# Patient Record
Sex: Female | Born: 1965 | Race: Black or African American | Hispanic: No | Smoking: Former smoker
Health system: Southern US, Community
[De-identification: ages and names within clinical notes are randomized; demographics above are authoritative.]

## PROBLEM LIST (undated history)

## (undated) DIAGNOSIS — I10 Essential (primary) hypertension: Secondary | ICD-10-CM

## (undated) DIAGNOSIS — G43909 Migraine, unspecified, not intractable, without status migrainosus: Secondary | ICD-10-CM

## (undated) DIAGNOSIS — K5792 Diverticulitis of intestine, part unspecified, without perforation or abscess without bleeding: Secondary | ICD-10-CM

## (undated) DIAGNOSIS — F419 Anxiety disorder, unspecified: Secondary | ICD-10-CM

## (undated) HISTORY — PX: TUBAL LIGATION: SHX77

---

## 2015-10-19 DIAGNOSIS — Z0001 Encounter for general adult medical examination with abnormal findings: Secondary | ICD-10-CM | POA: Diagnosis not present

## 2015-10-19 DIAGNOSIS — Z881 Allergy status to other antibiotic agents status: Secondary | ICD-10-CM | POA: Diagnosis not present

## 2015-10-19 DIAGNOSIS — Z79899 Other long term (current) drug therapy: Secondary | ICD-10-CM | POA: Diagnosis not present

## 2015-10-19 DIAGNOSIS — K5700 Diverticulitis of small intestine with perforation and abscess without bleeding: Secondary | ICD-10-CM | POA: Diagnosis not present

## 2015-10-19 DIAGNOSIS — Z880 Allergy status to penicillin: Secondary | ICD-10-CM | POA: Diagnosis not present

## 2015-10-19 DIAGNOSIS — Z83511 Family history of glaucoma: Secondary | ICD-10-CM | POA: Diagnosis not present

## 2015-10-19 DIAGNOSIS — K5792 Diverticulitis of intestine, part unspecified, without perforation or abscess without bleeding: Secondary | ICD-10-CM | POA: Diagnosis not present

## 2015-10-19 DIAGNOSIS — D589 Hereditary hemolytic anemia, unspecified: Secondary | ICD-10-CM | POA: Diagnosis not present

## 2015-10-19 DIAGNOSIS — K589 Irritable bowel syndrome without diarrhea: Secondary | ICD-10-CM | POA: Diagnosis not present

## 2015-10-19 DIAGNOSIS — R799 Abnormal finding of blood chemistry, unspecified: Secondary | ICD-10-CM | POA: Diagnosis not present

## 2015-10-19 DIAGNOSIS — I10 Essential (primary) hypertension: Secondary | ICD-10-CM | POA: Diagnosis not present

## 2015-10-19 DIAGNOSIS — Z87891 Personal history of nicotine dependence: Secondary | ICD-10-CM | POA: Diagnosis not present

## 2015-10-19 DIAGNOSIS — Z888 Allergy status to other drugs, medicaments and biological substances status: Secondary | ICD-10-CM | POA: Diagnosis not present

## 2015-10-28 DIAGNOSIS — Z1231 Encounter for screening mammogram for malignant neoplasm of breast: Secondary | ICD-10-CM | POA: Diagnosis not present

## 2015-11-15 DIAGNOSIS — R109 Unspecified abdominal pain: Secondary | ICD-10-CM | POA: Diagnosis not present

## 2015-11-15 DIAGNOSIS — K5721 Diverticulitis of large intestine with perforation and abscess with bleeding: Secondary | ICD-10-CM | POA: Diagnosis not present

## 2015-11-15 DIAGNOSIS — K5909 Other constipation: Secondary | ICD-10-CM | POA: Diagnosis not present

## 2015-11-15 DIAGNOSIS — K580 Irritable bowel syndrome with diarrhea: Secondary | ICD-10-CM | POA: Diagnosis not present

## 2015-11-15 DIAGNOSIS — Z23 Encounter for immunization: Secondary | ICD-10-CM | POA: Diagnosis not present

## 2015-11-15 DIAGNOSIS — R630 Anorexia: Secondary | ICD-10-CM | POA: Diagnosis not present

## 2015-11-15 DIAGNOSIS — Z888 Allergy status to other drugs, medicaments and biological substances status: Secondary | ICD-10-CM | POA: Diagnosis not present

## 2015-11-15 DIAGNOSIS — Z881 Allergy status to other antibiotic agents status: Secondary | ICD-10-CM | POA: Diagnosis not present

## 2015-11-15 DIAGNOSIS — Z880 Allergy status to penicillin: Secondary | ICD-10-CM | POA: Diagnosis not present

## 2015-11-15 DIAGNOSIS — Z79899 Other long term (current) drug therapy: Secondary | ICD-10-CM | POA: Diagnosis not present

## 2015-11-15 DIAGNOSIS — Z87891 Personal history of nicotine dependence: Secondary | ICD-10-CM | POA: Diagnosis not present

## 2015-11-15 DIAGNOSIS — R194 Change in bowel habit: Secondary | ICD-10-CM | POA: Diagnosis not present

## 2015-12-30 DIAGNOSIS — K909 Intestinal malabsorption, unspecified: Secondary | ICD-10-CM | POA: Diagnosis not present

## 2015-12-30 DIAGNOSIS — K5900 Constipation, unspecified: Secondary | ICD-10-CM | POA: Diagnosis not present

## 2015-12-30 DIAGNOSIS — R140 Abdominal distension (gaseous): Secondary | ICD-10-CM | POA: Diagnosis not present

## 2015-12-30 DIAGNOSIS — K5909 Other constipation: Secondary | ICD-10-CM | POA: Diagnosis not present

## 2015-12-30 DIAGNOSIS — R630 Anorexia: Secondary | ICD-10-CM | POA: Diagnosis not present

## 2015-12-30 DIAGNOSIS — R143 Flatulence: Secondary | ICD-10-CM | POA: Diagnosis not present

## 2015-12-30 DIAGNOSIS — R1030 Lower abdominal pain, unspecified: Secondary | ICD-10-CM | POA: Diagnosis not present

## 2016-02-03 DIAGNOSIS — H527 Unspecified disorder of refraction: Secondary | ICD-10-CM | POA: Diagnosis not present

## 2016-02-03 DIAGNOSIS — H04123 Dry eye syndrome of bilateral lacrimal glands: Secondary | ICD-10-CM | POA: Diagnosis not present

## 2016-02-03 DIAGNOSIS — Z83511 Family history of glaucoma: Secondary | ICD-10-CM | POA: Diagnosis not present

## 2016-02-03 DIAGNOSIS — H5213 Myopia, bilateral: Secondary | ICD-10-CM | POA: Diagnosis not present

## 2016-02-03 DIAGNOSIS — Z87891 Personal history of nicotine dependence: Secondary | ICD-10-CM | POA: Diagnosis not present

## 2016-02-03 DIAGNOSIS — H524 Presbyopia: Secondary | ICD-10-CM | POA: Diagnosis not present

## 2016-02-03 DIAGNOSIS — Z79899 Other long term (current) drug therapy: Secondary | ICD-10-CM | POA: Diagnosis not present

## 2016-02-03 DIAGNOSIS — H40013 Open angle with borderline findings, low risk, bilateral: Secondary | ICD-10-CM | POA: Diagnosis not present

## 2016-02-22 DIAGNOSIS — Z0000 Encounter for general adult medical examination without abnormal findings: Secondary | ICD-10-CM | POA: Diagnosis not present

## 2016-02-22 DIAGNOSIS — K580 Irritable bowel syndrome with diarrhea: Secondary | ICD-10-CM | POA: Diagnosis not present

## 2016-02-22 DIAGNOSIS — I10 Essential (primary) hypertension: Secondary | ICD-10-CM | POA: Diagnosis not present

## 2016-05-03 DIAGNOSIS — R300 Dysuria: Secondary | ICD-10-CM | POA: Diagnosis not present

## 2016-05-03 DIAGNOSIS — R1031 Right lower quadrant pain: Secondary | ICD-10-CM | POA: Diagnosis not present

## 2016-05-03 DIAGNOSIS — R6883 Chills (without fever): Secondary | ICD-10-CM | POA: Diagnosis not present

## 2016-05-03 DIAGNOSIS — K5730 Diverticulosis of large intestine without perforation or abscess without bleeding: Secondary | ICD-10-CM | POA: Diagnosis not present

## 2016-05-03 DIAGNOSIS — R112 Nausea with vomiting, unspecified: Secondary | ICD-10-CM | POA: Diagnosis not present

## 2016-05-03 DIAGNOSIS — Z6830 Body mass index (BMI) 30.0-30.9, adult: Secondary | ICD-10-CM | POA: Diagnosis not present

## 2016-05-03 DIAGNOSIS — R630 Anorexia: Secondary | ICD-10-CM | POA: Diagnosis not present

## 2016-05-03 DIAGNOSIS — R1032 Left lower quadrant pain: Secondary | ICD-10-CM | POA: Diagnosis not present

## 2016-05-03 DIAGNOSIS — Z87891 Personal history of nicotine dependence: Secondary | ICD-10-CM | POA: Diagnosis not present

## 2016-05-04 ENCOUNTER — Encounter (HOSPITAL_COMMUNITY): Payer: Self-pay | Admitting: Family Medicine

## 2016-05-04 ENCOUNTER — Inpatient Hospital Stay (HOSPITAL_COMMUNITY)
Admission: EM | Admit: 2016-05-04 | Discharge: 2016-05-11 | DRG: 392 | Disposition: A | Discharge status: Home / Self Care | Payer: Medicare Other | Attending: General Surgery | Admitting: General Surgery

## 2016-05-04 DIAGNOSIS — K5780 Diverticulitis of intestine, part unspecified, with perforation and abscess without bleeding: Secondary | ICD-10-CM | POA: Diagnosis not present

## 2016-05-04 DIAGNOSIS — K630 Abscess of intestine: Secondary | ICD-10-CM | POA: Diagnosis not present

## 2016-05-04 DIAGNOSIS — Z881 Allergy status to other antibiotic agents status: Secondary | ICD-10-CM

## 2016-05-04 DIAGNOSIS — I10 Essential (primary) hypertension: Secondary | ICD-10-CM | POA: Diagnosis present

## 2016-05-04 DIAGNOSIS — R1084 Generalized abdominal pain: Secondary | ICD-10-CM | POA: Diagnosis not present

## 2016-05-04 DIAGNOSIS — Z113 Encounter for screening for infections with a predominantly sexual mode of transmission: Secondary | ICD-10-CM | POA: Diagnosis not present

## 2016-05-04 DIAGNOSIS — R1030 Lower abdominal pain, unspecified: Secondary | ICD-10-CM | POA: Diagnosis not present

## 2016-05-04 DIAGNOSIS — B954 Other streptococcus as the cause of diseases classified elsewhere: Secondary | ICD-10-CM | POA: Diagnosis not present

## 2016-05-04 DIAGNOSIS — R87610 Atypical squamous cells of undetermined significance on cytologic smear of cervix (ASC-US): Secondary | ICD-10-CM | POA: Diagnosis not present

## 2016-05-04 DIAGNOSIS — Z87891 Personal history of nicotine dependence: Secondary | ICD-10-CM | POA: Diagnosis not present

## 2016-05-04 DIAGNOSIS — Z01419 Encounter for gynecological examination (general) (routine) without abnormal findings: Secondary | ICD-10-CM | POA: Diagnosis not present

## 2016-05-04 DIAGNOSIS — K5732 Diverticulitis of large intestine without perforation or abscess without bleeding: Secondary | ICD-10-CM

## 2016-05-04 DIAGNOSIS — Z880 Allergy status to penicillin: Secondary | ICD-10-CM

## 2016-05-04 DIAGNOSIS — Z91013 Allergy to seafood: Secondary | ICD-10-CM | POA: Diagnosis not present

## 2016-05-04 DIAGNOSIS — K5792 Diverticulitis of intestine, part unspecified, without perforation or abscess without bleeding: Secondary | ICD-10-CM | POA: Diagnosis not present

## 2016-05-04 DIAGNOSIS — K5720 Diverticulitis of large intestine with perforation and abscess without bleeding: Secondary | ICD-10-CM | POA: Diagnosis not present

## 2016-05-04 DIAGNOSIS — R197 Diarrhea, unspecified: Secondary | ICD-10-CM | POA: Diagnosis not present

## 2016-05-04 DIAGNOSIS — L0291 Cutaneous abscess, unspecified: Secondary | ICD-10-CM

## 2016-05-04 DIAGNOSIS — D259 Leiomyoma of uterus, unspecified: Secondary | ICD-10-CM | POA: Diagnosis not present

## 2016-05-04 HISTORY — DX: Essential (primary) hypertension: I10

## 2016-05-04 HISTORY — DX: Diverticulitis of intestine, part unspecified, without perforation or abscess without bleeding: K57.92

## 2016-05-04 LAB — URINALYSIS, ROUTINE W REFLEX MICROSCOPIC
GLUCOSE, UA: NEGATIVE mg/dL
Hgb urine dipstick: NEGATIVE
Ketones, ur: >80 mg/dL — AB
LEUKOCYTES UA: NEGATIVE
Nitrite: NEGATIVE
PROTEIN: 30 mg/dL — AB
SPECIFIC GRAVITY, URINE: 1.031 — AB (ref 1.005–1.030)
pH: 6 (ref 5.0–8.0)

## 2016-05-04 LAB — COMPREHENSIVE METABOLIC PANEL
ALT: 13 U/L — ABNORMAL LOW (ref 14–54)
ANION GAP: 12 (ref 5–15)
AST: 16 U/L (ref 15–41)
Albumin: 3.4 g/dL — ABNORMAL LOW (ref 3.5–5.0)
Alkaline Phosphatase: 66 U/L (ref 38–126)
BUN: <5 mg/dL — ABNORMAL LOW (ref 6–20)
CHLORIDE: 105 mmol/L (ref 101–111)
CO2: 20 mmol/L — AB (ref 22–32)
CREATININE: 0.87 mg/dL (ref 0.44–1.00)
Calcium: 9.2 mg/dL (ref 8.9–10.3)
Glucose, Bld: 101 mg/dL — ABNORMAL HIGH (ref 65–99)
POTASSIUM: 3.2 mmol/L — AB (ref 3.5–5.1)
SODIUM: 137 mmol/L (ref 135–145)
Total Bilirubin: 0.9 mg/dL (ref 0.3–1.2)
Total Protein: 7.6 g/dL (ref 6.5–8.1)

## 2016-05-04 LAB — CBC
HEMATOCRIT: 37.8 % (ref 36.0–46.0)
HEMOGLOBIN: 13.3 g/dL (ref 12.0–15.0)
MCH: 32.9 pg (ref 26.0–34.0)
MCHC: 35.2 g/dL (ref 30.0–36.0)
MCV: 93.6 fL (ref 78.0–100.0)
PLATELETS: 349 10*3/uL (ref 150–400)
RBC: 4.04 MIL/uL (ref 3.87–5.11)
RDW: 13.2 % (ref 11.5–15.5)
WBC: 20.5 10*3/uL — AB (ref 4.0–10.5)

## 2016-05-04 LAB — URINE MICROSCOPIC-ADD ON

## 2016-05-04 LAB — I-STAT BETA HCG BLOOD, ED (MC, WL, AP ONLY): I-stat hCG, quantitative: <5 m[IU]/mL (ref <5)

## 2016-05-04 LAB — LIPASE, BLOOD: LIPASE: 15 U/L (ref 11–51)

## 2016-05-04 MED ORDER — ONDANSETRON 4 MG PO TBDP
ORAL_TABLET | ORAL | Status: AC
  Filled 2016-05-04: qty 1

## 2016-05-04 MED ORDER — ONDANSETRON 4 MG PO TBDP
4.0000 mg | ORAL_TABLET | Freq: Once | ORAL | Status: AC | PRN
  Administered 2016-05-04: 4 mg via ORAL

## 2016-05-04 NOTE — ED Provider Notes (Addendum)
[place] DEPT Provider Note   CSN: [id number] Arrival date & time: 05/04/16  1804  By signing my name below, I, Maud Deed. Royston Sinner, attest that this documentation has been prepared under the direction and in the presence of Jola Schmidt, MD.  Electronically Signed: Maud Deed. Royston Sinner, ED Scribe. 05/05/16. 12:21 AM.    History   Chief Complaint Chief Complaint  Patient presents with  . Abdominal Pain   The history is provided by the patient and a relative. No language interpreter was used.    HPI Comments: Jasmine Cobb, brought in by POV is a 50 y.o. female with a PMHx of diverticulitis and HTN who presents to the Emergency Department complaining of constant, worsening lower abdominal pain that radiates to the back x 5 days. Pain is described as sharp. No aggravating or alleviating factors at this time. She also reports associated nausea, vomiting, constipation, and "pus in my stool". No recent fever, chills, shortness of breath, or chest pain. Pt states she was evaluated at [facility] last night for same and was told she needed surgery per triage note. However, pt left AMA. Pt admits to an episode of diverticulitis 2 years ago. At that time, she was treated with antibiotics. However, surgery was not indicated for treatment.  PCP: No primary care provider on file.    Past Medical History:  Diagnosis Date  . Diverticulitis   . Hypertension     There are no active problems to display for this patient.   Past Surgical History:  Procedure Laterality Date  . TUBAL LIGATION      OB History    No data available       Home Medications    Prior to Admission medications   Not on File    Family History No family history on file.  Social History Social History  Substance Use Topics  . Smoking status: Former Smoker    Types: Cigarettes  . Smokeless tobacco: Never Used  . Alcohol use Yes     Comment: Rare     Allergies   Ciprofloxacin; Penicillins; and Shellfish  allergy   Review of Systems Review of Systems  Constitutional: Negative for chills and fever.  Respiratory: Negative for shortness of breath.   Cardiovascular: Negative for chest pain.  Gastrointestinal: Positive for abdominal pain, constipation, nausea and vomiting.  All other systems reviewed and are negative.    Physical Exam Updated Vital Signs BP 153/81 (BP Location: Right Arm)   Pulse 71   Temp 98.4 F (36.9 C) (Oral)   Resp 20   LMP 04/21/2016   SpO2 100%   Physical Exam  Constitutional: She is oriented to person, place, and time. She appears well-developed and well-nourished. No distress.  HENT:  Head: Normocephalic and atraumatic.  Eyes: EOM are normal.  Neck: Normal range of motion.  Cardiovascular: Normal rate, regular rhythm and normal heart sounds.   Pulmonary/Chest: Effort normal and breath sounds normal.  Abdominal: Soft. She exhibits no distension. There is tenderness.  Tenderness to palpation over suprapubic region and LLQ.  Musculoskeletal: Normal range of motion.  Neurological: She is alert and oriented to person, place, and time.  Skin: Skin is warm and dry.  Psychiatric: She has a normal mood and affect. Judgment normal.  Nursing note and vitals reviewed.    ED Treatments / Results   DIAGNOSTIC STUDIES: Oxygen Saturation is 100% on RA, Normal by my interpretation.    COORDINATION OF CARE: 12:18 AM- Will give fluids, Zofran, and Dilaudid.  Will order imaging, pregnancy urine, blood work, and urinalysis. Discussed treatment plan with pt at bedside and pt agreed to plan.     Labs (all labs ordered are listed, but only abnormal results are displayed) Labs Reviewed  COMPREHENSIVE METABOLIC PANEL - Abnormal; Notable for the following:       Result Value   Potassium 3.2 (*)    CO2 20 (*)    Glucose, Bld 101 (*)    BUN <5 (*)    Albumin 3.4 (*)    ALT 13 (*)    All other components within normal limits  CBC - Abnormal; Notable for the  following:    WBC 20.5 (*)    All other components within normal limits  URINALYSIS, ROUTINE W REFLEX MICROSCOPIC (NOT AT [facility]) - Abnormal; Notable for the following:    APPearance HAZY (*)    Specific Gravity, Urine 1.031 (*)    Bilirubin Urine SMALL (*)    Ketones, ur >80 (*)    Protein, ur 30 (*)    All other components within normal limits  URINE MICROSCOPIC-ADD ON - Abnormal; Notable for the following:    Squamous Epithelial / LPF 6-30 (*)    Bacteria, UA FEW (*)    All other components within normal limits  LIPASE, BLOOD  I-STAT BETA HCG BLOOD, ED ([facility], [facility], [facility] ONLY)    EKG  EKG Interpretation None       Radiology No results found.  Procedures Procedures (including critical care time)   +++++++++++++++++++++++++++++++++++++++++  CRITICAL CARE Performed by: Hoy Morn Total critical care time: 35 minutes Critical care time was exclusive of separately billable procedures and treating other patients. Critical care was necessary to treat or prevent imminent or life-threatening deterioration. Critical care was time spent personally by me on the following activities: development of treatment plan with patient and/or surrogate as well as nursing, discussions with consultants, evaluation of patient's response to treatment, examination of patient, obtaining history from patient or surrogate, ordering and performing treatments and interventions, ordering and review of laboratory studies, ordering and review of radiographic studies, pulse oximetry and re-evaluation of patient's condition.   +++++++++++++++++++++++++++++++++++++++++++++++   Medications Ordered in ED Medications  0.9 %  sodium chloride infusion (1,000 mLs Intravenous New Bag/Given 05/05/16 0041)    Followed by  0.9 %  sodium chloride infusion (1,000 mLs Intravenous New Bag/Given 05/05/16 0042)  ceFEPIme (MAXIPIME) 2 g in dextrose 5 % 50 mL IVPB (not administered)    And  metroNIDAZOLE (FLAGYL) IVPB 500 mg  (not administered)  ondansetron (ZOFRAN-ODT) disintegrating tablet 4 mg (4 mg Oral Given 05/04/16 1828)  ondansetron (ZOFRAN) injection 4 mg (4 mg Intravenous Given 05/05/16 0042)  HYDROmorphone (DILAUDID) injection 1 mg (1 mg Intravenous Given 05/05/16 0042)  iopamidol (ISOVUE-300) 61 % injection (100 mLs  Contrast Given 05/05/16 0117)     Initial Impression / Assessment and Plan / ED Course  I have reviewed the triage vital signs and the nursing notes.  Pertinent labs & imaging results that were available during my care of the patient were reviewed by me and considered in my medical decision making (see chart for details).  Clinical Course    Patient with a 3 cm intra-abdominal abscess likely diverticular in nature given her history of diverticulitis this is a contained perforation.  Patient has a penicillin allergy therefore she cannot receive Zosyn and so I have given her cefepime and Flagyl IV.  I'll contact general surgery for consultation and possible admission versus  hospitalist admission for IV antibiotics.    Final Clinical Impressions(s) / ED Diagnoses   Final diagnoses:  Colonic diverticular abscess    New Prescriptions New Prescriptions   No medications on file   I personally performed the services described in this documentation, which was scribed in my presence. The recorded information has been reviewed and is accurate.        Jola Schmidt, MD 05/05/16 [place], MD 05/05/16 [phone]

## 2016-05-04 NOTE — ED Notes (Signed)
Pt up to desk, updated on wait, encouraged, alert, NAD, calm, interactive, resps e/u, c/o nv and abd pain, actively vomiting.

## 2016-05-04 NOTE — ED Triage Notes (Addendum)
Pt presents from home via POV with c/o low abdominal pain that began 8 days ago and has gradually worsened.  Hx diverticulosis/diverticulitis and tried to manage the pain at home. Endorses NV and constipation, denies diarrhea.  A&Ox4. States was seen at [facility] last night and was told she needed surgery, but left AMA.

## 2016-05-04 NOTE — ED Notes (Signed)
Pt called for vitals x2, no answer 

## 2016-05-04 NOTE — ED Notes (Signed)
Pt unable to void at this time. 

## 2016-05-05 ENCOUNTER — Encounter (HOSPITAL_COMMUNITY): Payer: Self-pay | Admitting: General Surgery

## 2016-05-05 ENCOUNTER — Emergency Department (HOSPITAL_COMMUNITY): Payer: Medicare Other

## 2016-05-05 ENCOUNTER — Inpatient Hospital Stay (HOSPITAL_COMMUNITY): Payer: Medicare Other

## 2016-05-05 DIAGNOSIS — I10 Essential (primary) hypertension: Secondary | ICD-10-CM | POA: Diagnosis present

## 2016-05-05 DIAGNOSIS — K5780 Diverticulitis of intestine, part unspecified, with perforation and abscess without bleeding: Secondary | ICD-10-CM | POA: Diagnosis not present

## 2016-05-05 DIAGNOSIS — Z880 Allergy status to penicillin: Secondary | ICD-10-CM | POA: Diagnosis not present

## 2016-05-05 DIAGNOSIS — Z91013 Allergy to seafood: Secondary | ICD-10-CM | POA: Diagnosis not present

## 2016-05-05 DIAGNOSIS — B954 Other streptococcus as the cause of diseases classified elsewhere: Secondary | ICD-10-CM | POA: Diagnosis present

## 2016-05-05 DIAGNOSIS — R1030 Lower abdominal pain, unspecified: Secondary | ICD-10-CM | POA: Diagnosis not present

## 2016-05-05 DIAGNOSIS — D259 Leiomyoma of uterus, unspecified: Secondary | ICD-10-CM | POA: Diagnosis not present

## 2016-05-05 DIAGNOSIS — R1084 Generalized abdominal pain: Secondary | ICD-10-CM | POA: Diagnosis not present

## 2016-05-05 DIAGNOSIS — Z87891 Personal history of nicotine dependence: Secondary | ICD-10-CM | POA: Diagnosis not present

## 2016-05-05 DIAGNOSIS — K5720 Diverticulitis of large intestine with perforation and abscess without bleeding: Secondary | ICD-10-CM | POA: Diagnosis present

## 2016-05-05 DIAGNOSIS — Z881 Allergy status to other antibiotic agents status: Secondary | ICD-10-CM | POA: Diagnosis not present

## 2016-05-05 DIAGNOSIS — K651 Peritoneal abscess: Secondary | ICD-10-CM | POA: Diagnosis not present

## 2016-05-05 DIAGNOSIS — N739 Female pelvic inflammatory disease, unspecified: Secondary | ICD-10-CM | POA: Diagnosis not present

## 2016-05-05 LAB — CBC
HCT: 34.9 % — ABNORMAL LOW (ref 36.0–46.0)
HEMOGLOBIN: 11.7 g/dL — AB (ref 12.0–15.0)
MCH: 32.3 pg (ref 26.0–34.0)
MCHC: 33.5 g/dL (ref 30.0–36.0)
MCV: 96.4 fL (ref 78.0–100.0)
Platelets: 296 10*3/uL (ref 150–400)
RBC: 3.62 MIL/uL — AB (ref 3.87–5.11)
RDW: 13.2 % (ref 11.5–15.5)
WBC: 22.4 10*3/uL — AB (ref 4.0–10.5)

## 2016-05-05 LAB — BASIC METABOLIC PANEL
ANION GAP: 10 (ref 5–15)
BUN: <5 mg/dL — ABNORMAL LOW (ref 6–20)
CHLORIDE: 107 mmol/L (ref 101–111)
CO2: 22 mmol/L (ref 22–32)
CREATININE: 0.8 mg/dL (ref 0.44–1.00)
Calcium: 8.3 mg/dL — ABNORMAL LOW (ref 8.9–10.3)
GFR calc non Af Amer: >60 mL/min (ref >60)
Glucose, Bld: 117 mg/dL — ABNORMAL HIGH (ref 65–99)
Potassium: 3.6 mmol/L (ref 3.5–5.1)
SODIUM: 139 mmol/L (ref 135–145)

## 2016-05-05 LAB — PROTIME-INR
INR: 1.12
PROTHROMBIN TIME: 14.5 s (ref 11.4–15.2)

## 2016-05-05 LAB — APTT: APTT: 37 s — AB (ref 24–36)

## 2016-05-05 MED ORDER — ONDANSETRON HCL 4 MG/2ML IJ SOLN
4.0000 mg | Freq: Four times a day (QID) | INTRAMUSCULAR | Status: DC | PRN
  Administered 2016-05-05: 4 mg via INTRAVENOUS
  Filled 2016-05-05: qty 2

## 2016-05-05 MED ORDER — KCL IN DEXTROSE-NACL 20-5-0.45 MEQ/L-%-% IV SOLN
INTRAVENOUS | Status: DC
  Administered 2016-05-05 – 2016-05-08 (×7): via INTRAVENOUS
  Filled 2016-05-05 (×6): qty 1000

## 2016-05-05 MED ORDER — DIPHENHYDRAMINE HCL 25 MG PO CAPS: 25.0000 mg | ORAL_CAPSULE | Freq: Four times a day (QID) | ORAL | Status: DC | PRN

## 2016-05-05 MED ORDER — HYDROMORPHONE HCL 1 MG/ML IJ SOLN
1.0000 mg | Freq: Once | INTRAMUSCULAR | Status: AC
  Administered 2016-05-05: 1 mg via INTRAVENOUS
  Filled 2016-05-05: qty 1

## 2016-05-05 MED ORDER — DEXTROSE 5 % IV SOLN
2.0000 g | Freq: Three times a day (TID) | INTRAVENOUS | Status: DC
  Administered 2016-05-05 – 2016-05-10 (×15): 2 g via INTRAVENOUS
  Filled 2016-05-05 (×19): qty 2

## 2016-05-05 MED ORDER — ACETAMINOPHEN 325 MG PO TABS
650.0000 mg | ORAL_TABLET | Freq: Four times a day (QID) | ORAL | Status: DC | PRN
  Administered 2016-05-06: 650 mg via ORAL
  Filled 2016-05-05: qty 2

## 2016-05-05 MED ORDER — ONDANSETRON HCL 4 MG/2ML IJ SOLN
4.0000 mg | Freq: Once | INTRAMUSCULAR | Status: AC
  Administered 2016-05-05: 4 mg via INTRAVENOUS
  Filled 2016-05-05: qty 2

## 2016-05-05 MED ORDER — DEXTROSE 5 % IV SOLN
2.0000 g | Freq: Once | INTRAVENOUS | Status: AC
  Administered 2016-05-05: 2 g via INTRAVENOUS
  Filled 2016-05-05: qty 2

## 2016-05-05 MED ORDER — ONDANSETRON 4 MG PO TBDP
4.0000 mg | ORAL_TABLET | Freq: Four times a day (QID) | ORAL | Status: DC | PRN
Start: 2016-05-05 — End: 2016-05-11

## 2016-05-05 MED ORDER — LIDOCAINE HCL 1 % IJ SOLN
INTRAMUSCULAR | Status: AC
  Filled 2016-05-05: qty 20

## 2016-05-05 MED ORDER — METRONIDAZOLE IN NACL 5-0.79 MG/ML-% IV SOLN
500.0000 mg | Freq: Once | INTRAVENOUS | Status: AC
  Administered 2016-05-05: 500 mg via INTRAVENOUS
  Filled 2016-05-05: qty 100

## 2016-05-05 MED ORDER — METRONIDAZOLE IN NACL 5-0.79 MG/ML-% IV SOLN
500.0000 mg | Freq: Three times a day (TID) | INTRAVENOUS | Status: DC
  Administered 2016-05-05 – 2016-05-10 (×15): 500 mg via INTRAVENOUS
  Filled 2016-05-05 (×19): qty 100

## 2016-05-05 MED ORDER — FENTANYL CITRATE (PF) 100 MCG/2ML IJ SOLN
INTRAMUSCULAR | Status: AC | PRN
  Administered 2016-05-05 (×4): 50 ug via INTRAVENOUS

## 2016-05-05 MED ORDER — ACETAMINOPHEN 650 MG RE SUPP: 650.0000 mg | Freq: Four times a day (QID) | RECTAL | Status: DC | PRN

## 2016-05-05 MED ORDER — ENOXAPARIN SODIUM 40 MG/0.4ML ~~LOC~~ SOLN
40.0000 mg | SUBCUTANEOUS | Status: DC
  Administered 2016-05-06 – 2016-05-08 (×2): 40 mg via SUBCUTANEOUS
  Filled 2016-05-05 (×2): qty 0.4

## 2016-05-05 MED ORDER — WHITE PETROLATUM GEL
Status: AC
  Administered 2016-05-05: 05:00:00
  Filled 2016-05-05: qty 1

## 2016-05-05 MED ORDER — FENTANYL CITRATE (PF) 100 MCG/2ML IJ SOLN
INTRAMUSCULAR | Status: AC
  Filled 2016-05-05: qty 4

## 2016-05-05 MED ORDER — MIDAZOLAM HCL 2 MG/2ML IJ SOLN
INTRAMUSCULAR | Status: AC
  Filled 2016-05-05: qty 6

## 2016-05-05 MED ORDER — OXYCODONE HCL 5 MG PO TABS: 5.0000 mg | ORAL_TABLET | ORAL | Status: DC | PRN

## 2016-05-05 MED ORDER — MIDAZOLAM HCL 2 MG/2ML IJ SOLN
INTRAMUSCULAR | Status: AC | PRN
  Administered 2016-05-05: 1 mg via INTRAVENOUS
  Administered 2016-05-05: 2 mg via INTRAVENOUS
  Administered 2016-05-05: 1 mg via INTRAVENOUS

## 2016-05-05 MED ORDER — IOPAMIDOL (ISOVUE-300) INJECTION 61%
INTRAVENOUS | Status: AC
  Administered 2016-05-05: 100 mL
  Filled 2016-05-05: qty 100

## 2016-05-05 MED ORDER — MORPHINE SULFATE (PF) 2 MG/ML IV SOLN
2.0000 mg | INTRAVENOUS | Status: DC | PRN
  Administered 2016-05-05 (×2): 4 mg via INTRAVENOUS
  Administered 2016-05-05: 2 mg via INTRAVENOUS
  Administered 2016-05-05 – 2016-05-06 (×5): 4 mg via INTRAVENOUS
  Filled 2016-05-05: qty 1
  Filled 2016-05-05 (×7): qty 2

## 2016-05-05 MED ORDER — SODIUM CHLORIDE 0.9 % IV SOLN
1000.0000 mL | INTRAVENOUS | Status: DC
  Administered 2016-05-05: 1000 mL via INTRAVENOUS

## 2016-05-05 MED ORDER — ENOXAPARIN SODIUM 40 MG/0.4ML ~~LOC~~ SOLN: 40.0000 mg | SUBCUTANEOUS | Status: DC

## 2016-05-05 MED ORDER — DIPHENHYDRAMINE HCL 50 MG/ML IJ SOLN: 25.0000 mg | Freq: Four times a day (QID) | INTRAMUSCULAR | Status: DC | PRN

## 2016-05-05 MED ORDER — SODIUM CHLORIDE 0.9 % IV SOLN
1000.0000 mL | Freq: Once | INTRAVENOUS | Status: AC
  Administered 2016-05-05: 1000 mL via INTRAVENOUS

## 2016-05-05 MED ORDER — HYDRALAZINE HCL 20 MG/ML IJ SOLN: 10.0000 mg | INTRAMUSCULAR | Status: DC | PRN

## 2016-05-05 NOTE — Progress Notes (Signed)
Pt has not void yet. Bladder scan done: 241ml. Dr Paged.

## 2016-05-05 NOTE — Sedation Documentation (Signed)
Patient is resting comfortably. 

## 2016-05-05 NOTE — Consult Note (Signed)
Chief Complaint: diverticulitis with small abscess  Referring Physician:Dr. Gurney Maxin  Supervising Physician: Corrie Mckusick  Patient Status: In-pt   HPI: Jasmine Cobb is an 50 y.o. female who began having abdominal pain about a week ago.  She has had diverticulitis before, but her pain was on her left side.  This time it was in the middle and to the right and around her back.  She states he pain was worsening.  She went to the ED at [facility] yesterday, but left and presented here.  She had a CT scan that revealed mid sigmoid diverticulitis with soft tissue inflammation and focal abscess measuring 3.3x2.6x2.9cm.  She has been admitted and started on abx therapy.  We have been consulted for a possible drain placement.  Past Medical History:  Past Medical History:  Diagnosis Date  . Diverticulitis   . Hypertension     Past Surgical History:  Past Surgical History:  Procedure Laterality Date  . TUBAL LIGATION      Family History: History reviewed. No pertinent family history.  Social History:  reports that she has quit smoking. Her smoking use included Cigarettes. She has never used smokeless tobacco. She reports that she drinks alcohol. She reports that she uses drugs, including Marijuana.  Allergies:  Allergies  Allergen Reactions  . Shellfish Allergy Anaphylaxis  . Ciprofloxacin Swelling  . Penicillins Other (See Comments)    unknown    Medications: Medications reviewed in Epic  Please HPI for pertinent positives, otherwise complete 10 system ROS negative.  Mallampati Score: MD Evaluation Airway: WNL Heart: WNL Abdomen: WNL Chest/ Lungs: WNL ASA  Classification: 2 Mallampati/Airway Score: Two  Physical Exam: BP 136/82 (BP Location: Right Arm)   Pulse 80   Temp 98.4 F (36.9 C) (Oral)   Resp 20   LMP 04/21/2016   SpO2 96%  There is no height or weight on file to calculate BMI. General: pleasant, WD, WN black female who is laying in bed in  NAD HEENT: head is normocephalic, atraumatic.  Sclera are noninjected.  PERRL.  Ears and nose without any masses or lesions.  Mouth is pink and dry Heart: regular, rate, and rhythm.  Normal s1,s2. No obvious murmurs, gallops, or rubs noted.  Palpable radial and pedal pulses bilaterally Lungs: CTAB, no wheezes, rhonchi, or rales noted.  Respiratory effort nonlabored Abd: soft, tender in the middle potion of her abdomen and RLQ, ND, +BS, no masses, hernias, or organomegaly MS: all 4 extremities are symmetrical with no cyanosis, clubbing, or edema. Psych: A&Ox3 with an appropriate affect.   Labs: Results for orders placed or performed during the hospital encounter of 05/04/16 (from the past 48 hour(s))  Lipase, blood     Status: None   Collection Time: 05/04/16  6:41 PM  Result Value Ref Range   Lipase 15 11 - 51 U/L  Comprehensive metabolic panel     Status: Abnormal   Collection Time: 05/04/16  6:41 PM  Result Value Ref Range   Sodium 137 135 - 145 mmol/L   Potassium 3.2 (L) 3.5 - 5.1 mmol/L   Chloride 105 101 - 111 mmol/L   CO2 20 (L) 22 - 32 mmol/L   Glucose, Bld 101 (H) 65 - 99 mg/dL   BUN <5 (L) 6 - 20 mg/dL   Creatinine, Ser 0.87 0.44 - 1.00 mg/dL   Calcium 9.2 8.9 - 10.3 mg/dL   Total Protein 7.6 6.5 - 8.1 g/dL   Albumin 3.4 (L) 3.5 - 5.0  g/dL   AST 16 15 - 41 U/L   ALT 13 (L) 14 - 54 U/L   Alkaline Phosphatase 66 38 - 126 U/L   Total Bilirubin 0.9 0.3 - 1.2 mg/dL   GFR calc non Af Amer >60 >60 mL/min   GFR calc Af Amer >60 >60 mL/min    Comment: (NOTE) The eGFR has been calculated using the CKD EPI equation. This calculation has not been validated in all clinical situations. eGFR's persistently <60 mL/min signify possible Chronic Kidney Disease.    Anion gap 12 5 - 15  CBC     Status: Abnormal   Collection Time: 05/04/16  6:41 PM  Result Value Ref Range   WBC 20.5 (H) 4.0 - 10.5 K/uL   RBC 4.04 3.87 - 5.11 MIL/uL   Hemoglobin 13.3 12.0 - 15.0 g/dL   HCT 37.8 36.0  - 46.0 %   MCV 93.6 78.0 - 100.0 fL   MCH 32.9 26.0 - 34.0 pg   MCHC 35.2 30.0 - 36.0 g/dL   RDW 13.2 11.5 - 15.5 %   Platelets 349 150 - 400 K/uL  I-Stat beta hCG blood, ED     Status: None   Collection Time: 05/04/16  6:54 PM  Result Value Ref Range   I-stat hCG, quantitative <5.0 <5 mIU/mL   Comment 3            Comment:   GEST. AGE      CONC.  (mIU/mL)   <=1 WEEK        5 - 50     2 WEEKS       50 - 500     3 WEEKS       100 - 10,000     4 WEEKS     1,000 - 30,000        FEMALE AND NON-PREGNANT FEMALE:     LESS THAN 5 mIU/mL   Urinalysis, Routine w reflex microscopic     Status: Abnormal   Collection Time: 05/04/16  7:10 PM  Result Value Ref Range   Color, Urine YELLOW YELLOW   APPearance HAZY (A) CLEAR   Specific Gravity, Urine 1.031 (H) 1.005 - 1.030   pH 6.0 5.0 - 8.0   Glucose, UA NEGATIVE NEGATIVE mg/dL   Hgb urine dipstick NEGATIVE NEGATIVE   Bilirubin Urine SMALL (A) NEGATIVE   Ketones, ur >80 (A) NEGATIVE mg/dL   Protein, ur 30 (A) NEGATIVE mg/dL   Nitrite NEGATIVE NEGATIVE   Leukocytes, UA NEGATIVE NEGATIVE  Urine microscopic-add on     Status: Abnormal   Collection Time: 05/04/16  7:10 PM  Result Value Ref Range   Squamous Epithelial / LPF 6-30 (A) NONE SEEN   WBC, UA 0-5 0 - 5 WBC/hpf   RBC / HPF 0-5 0 - 5 RBC/hpf   Bacteria, UA FEW (A) NONE SEEN  CBC     Status: Abnormal   Collection Time: 05/05/16  3:35 AM  Result Value Ref Range   WBC 22.4 (H) 4.0 - 10.5 K/uL   RBC 3.62 (L) 3.87 - 5.11 MIL/uL   Hemoglobin 11.7 (L) 12.0 - 15.0 g/dL   HCT 34.9 (L) 36.0 - 46.0 %   MCV 96.4 78.0 - 100.0 fL   MCH 32.3 26.0 - 34.0 pg   MCHC 33.5 30.0 - 36.0 g/dL   RDW 13.2 11.5 - 15.5 %   Platelets 296 150 - 400 K/uL  Basic metabolic panel     Status: Abnormal  Collection Time: 05/05/16  3:35 AM  Result Value Ref Range   Sodium 139 135 - 145 mmol/L   Potassium 3.6 3.5 - 5.1 mmol/L   Chloride 107 101 - 111 mmol/L   CO2 22 22 - 32 mmol/L   Glucose, Bld 117 (H) 65 -  99 mg/dL   BUN <5 (L) 6 - 20 mg/dL   Creatinine, Ser 0.80 0.44 - 1.00 mg/dL   Calcium 8.3 (L) 8.9 - 10.3 mg/dL   GFR calc non Af Amer >60 >60 mL/min   GFR calc Af Amer >60 >60 mL/min    Comment: (NOTE) The eGFR has been calculated using the CKD EPI equation. This calculation has not been validated in all clinical situations. eGFR's persistently <60 mL/min signify possible Chronic Kidney Disease.    Anion gap 10 5 - 15  Protime-INR     Status: None   Collection Time: 05/05/16  7:08 AM  Result Value Ref Range   Prothrombin Time 14.5 11.4 - 15.2 seconds   INR 1.12   APTT     Status: Abnormal   Collection Time: 05/05/16  7:08 AM  Result Value Ref Range   aPTT 37 (H) 24 - 36 seconds    Comment:        IF BASELINE aPTT IS ELEVATED, SUGGEST PATIENT RISK ASSESSMENT BE USED TO DETERMINE APPROPRIATE ANTICOAGULANT THERAPY.     Imaging: Ct Abdomen Pelvis W Contrast  Result Date: 05/05/2016 CLINICAL DATA:  Acute onset of lower abdominal pain. Initial encounter. EXAM: CT ABDOMEN AND PELVIS WITH CONTRAST TECHNIQUE: Multidetector CT imaging of the abdomen and pelvis was performed using the standard protocol following bolus administration of intravenous contrast. CONTRAST:  146m ISOVUE-300 IOPAMIDOL (ISOVUE-300) INJECTION 61% COMPARISON:  None. FINDINGS: Lower chest: The lung bases are grossly clear. The visualized portions of the mediastinum are unremarkable. Hepatobiliary: The liver is unremarkable in appearance. Layering increased attenuation within the gallbladder may reflect sludge. The common bile duct remains normal in caliber. Pancreas: The pancreatic duct measures up to 5 mm in diameter, of uncertain significance. The pancreas is otherwise unremarkable. Spleen: The spleen is unremarkable in appearance. Adrenals/Urinary Tract: The adrenal glands are within normal limits. The kidneys are unremarkable. There is no evidence of hydronephrosis. No renal or ureteral stones are identified. No  perinephric stranding is seen. Stomach/Bowel: The stomach is grossly unremarkable in appearance. The small bowel is within normal limits. The appendix is not definitely seen; there is no evidence of appendicitis. Scattered diverticulosis is noted along the distal descending colon. There is diffuse wall thickening involving the mid sigmoid colon, with diffuse surrounding soft tissue inflammation and apparent focal wall abscess measuring 3.3 cm. There appears to be associated contained perforation extending superiorly from the wall abscess, with a contiguous focal abscess noted superior to the sigmoid colon, measuring 3.3 x 2.6 x 2.9 cm. Adjacent mildly prominent nodes are seen. This may reflect perforated diverticulitis or colitis. Vascular/Lymphatic: Mild scattered calcification is seen along the distal abdominal aorta and its branches. No additional pelvic sidewall lymphadenopathy is seen. Reproductive: The uterus contains scattered calcifications, likely reflecting multiple small fibroids. The uterus is otherwise grossly unremarkable. The ovaries are relatively symmetric. No suspicious adnexal masses are seen. The bladder is mildly distended and grossly unremarkable. Musculoskeletal: No acute osseous abnormalities are seen. The visualized musculature is unremarkable in appearance. Other: No additional soft tissue abnormalities are seen. IMPRESSION: 1. Diffuse wall thickening involving the mid sigmoid colon, with diffuse surrounding soft tissue inflammation and focal wall  abscess measuring 3.3 cm. There appears to be associated contained perforation extending superiorly from the wall abscess, with contiguous focal abscess superior to the sigmoid colon, measuring 3.3 x 2.6 x 2.9 cm. Adjacent mildly prominent nodes seen. This may reflect perforated diverticulitis or colitis. 2. Multiple small calcified uterine fibroids seen. 3. Pancreatic duct is dilated to 5 mm, of uncertain significance. Would correlate with  pancreatic lab values, and consider MRCP for further evaluation as deemed clinically appropriate. 4. Layering increased attenuation within the gallbladder may reflect sludge. Gallbladder otherwise unremarkable. 5. Scattered diverticulosis along the distal descending colon. 6. Mild scattered calcification along the distal abdominal aorta and its branches. These results were called by telephone at the time of interpretation on 05/05/2016 at 2:05 am to Dr. Jola Schmidt, who verbally acknowledged these results. Electronically Signed   By: Garald Balding M.D.   On: 05/05/2016 02:07    Assessment/Plan 1. Diverticulitis with abscess -Dr. Earleen Newport has reviewed her imaging.  She had a small abscess.  We will plan today for aspiration vs drain placement if able. -labs and vitals reviewed -Risks and Benefits discussed with the patient including bleeding, infection, damage to adjacent structures, bowel perforation/fistula connection, and sepsis. All of the patient's questions were answered, patient is agreeable to proceed. Consent signed and in chart.   Thank you for this interesting consult.  I greatly enjoyed meeting Jasmine Cobb and look forward to participating in their care.  A copy of this report was sent to the requesting provider on this date.  Electronically Signed: Henreitta Cea 05/05/2016, 10:28 AM   I spent a total of 40 Minutes    in face to face in clinical consultation, greater than 50% of which was counseling/coordinating care for diverticulitis with abscess \

## 2016-05-05 NOTE — Progress Notes (Signed)
Patient ID: Jasmine Cobb, female   DOB: 31-Dec-1965, 50 y.o.   MRN: [id number]  [facility] Surgery Progress Note     Subjective: Continued lower/right-sided abdominal pain radiating to right side of back. Some continued nausea but no recent emesis. Had loose BM this morning. Has not been able to urinate, but does not feel like she needs to. Awaiting IR consult.  States that she has struggled with constipation for years. Typically takes linzess which helps, but lately missed some doses.  Objective: Vital signs in last 24 hours: Temp:  [98.3 F (36.8 C)-98.4 F (36.9 C)] 98.4 F (36.9 C) (09/08 0441) Pulse Rate:  [62-89] 80 (09/08 0441) Resp:  [16-20] 20 (09/08 0441) BP: (118-153)/(81-95) 136/82 (09/08 0441) SpO2:  [93 %-100 %] 96 % (09/08 0441) Last BM Date: 05/04/16  Intake/Output from previous day: 09/07 0701 - 09/08 0700 In: 1662.5 [I.V.:1662.5] Out: -  Intake/Output this shift: No intake/output data recorded.  PE: Gen:  Alert, NAD, pleasant Card:  RRR, no M/G/R heard Pulm:  CTAB Abd: Soft, ND, +BS, tender suprapubic area and right side of abdomen  Lab Results:   Recent Labs  05/04/16 1841 05/05/16 0335  WBC 20.5* 22.4*  HGB 13.3 11.7*  HCT 37.8 34.9*  PLT 349 296   BMET  Recent Labs  05/04/16 1841 05/05/16 0335  NA 137 139  K 3.2* 3.6  CL 105 107  CO2 20* 22  GLUCOSE 101* 117*  BUN <5* <5*  CREATININE 0.87 0.80  CALCIUM 9.2 8.3*   PT/INR  Recent Labs  05/05/16 0708  LABPROT 14.5  INR 1.12   CMP     Component Value Date/Time   NA 139 05/05/2016 0335   K 3.6 05/05/2016 0335   CL 107 05/05/2016 0335   CO2 22 05/05/2016 0335   GLUCOSE 117 (H) 05/05/2016 0335   BUN <5 (L) 05/05/2016 0335   CREATININE 0.80 05/05/2016 0335   CALCIUM 8.3 (L) 05/05/2016 0335   PROT 7.6 05/04/2016 1841   ALBUMIN 3.4 (L) 05/04/2016 1841   AST 16 05/04/2016 1841   ALT 13 (L) 05/04/2016 1841   ALKPHOS 66 05/04/2016 1841   BILITOT 0.9 05/04/2016 1841    GFRNONAA >60 05/05/2016 0335   GFRAA >60 05/05/2016 0335   Lipase     Component Value Date/Time   LIPASE 15 05/04/2016 1841       Studies/Results: Ct Abdomen Pelvis W Contrast  Result Date: 05/05/2016 CLINICAL DATA:  Acute onset of lower abdominal pain. Initial encounter. EXAM: CT ABDOMEN AND PELVIS WITH CONTRAST TECHNIQUE: Multidetector CT imaging of the abdomen and pelvis was performed using the standard protocol following bolus administration of intravenous contrast. CONTRAST:  126mL ISOVUE-300 IOPAMIDOL (ISOVUE-300) INJECTION 61% COMPARISON:  None. FINDINGS: Lower chest: The lung bases are grossly clear. The visualized portions of the mediastinum are unremarkable. Hepatobiliary: The liver is unremarkable in appearance. Layering increased attenuation within the gallbladder may reflect sludge. The common bile duct remains normal in caliber. Pancreas: The pancreatic duct measures up to 5 mm in diameter, of uncertain significance. The pancreas is otherwise unremarkable. Spleen: The spleen is unremarkable in appearance. Adrenals/Urinary Tract: The adrenal glands are within normal limits. The kidneys are unremarkable. There is no evidence of hydronephrosis. No renal or ureteral stones are identified. No perinephric stranding is seen. Stomach/Bowel: The stomach is grossly unremarkable in appearance. The small bowel is within normal limits. The appendix is not definitely seen; there is no evidence of appendicitis. Scattered diverticulosis is noted along  the distal descending colon. There is diffuse wall thickening involving the mid sigmoid colon, with diffuse surrounding soft tissue inflammation and apparent focal wall abscess measuring 3.3 cm. There appears to be associated contained perforation extending superiorly from the wall abscess, with a contiguous focal abscess noted superior to the sigmoid colon, measuring 3.3 x 2.6 x 2.9 cm. Adjacent mildly prominent nodes are seen. This may reflect perforated  diverticulitis or colitis. Vascular/Lymphatic: Mild scattered calcification is seen along the distal abdominal aorta and its branches. No additional pelvic sidewall lymphadenopathy is seen. Reproductive: The uterus contains scattered calcifications, likely reflecting multiple small fibroids. The uterus is otherwise grossly unremarkable. The ovaries are relatively symmetric. No suspicious adnexal masses are seen. The bladder is mildly distended and grossly unremarkable. Musculoskeletal: No acute osseous abnormalities are seen. The visualized musculature is unremarkable in appearance. Other: No additional soft tissue abnormalities are seen. IMPRESSION: 1. Diffuse wall thickening involving the mid sigmoid colon, with diffuse surrounding soft tissue inflammation and focal wall abscess measuring 3.3 cm. There appears to be associated contained perforation extending superiorly from the wall abscess, with contiguous focal abscess superior to the sigmoid colon, measuring 3.3 x 2.6 x 2.9 cm. Adjacent mildly prominent nodes seen. This may reflect perforated diverticulitis or colitis. 2. Multiple small calcified uterine fibroids seen. 3. Pancreatic duct is dilated to 5 mm, of uncertain significance. Would correlate with pancreatic lab values, and consider MRCP for further evaluation as deemed clinically appropriate. 4. Layering increased attenuation within the gallbladder may reflect sludge. Gallbladder otherwise unremarkable. 5. Scattered diverticulosis along the distal descending colon. 6. Mild scattered calcification along the distal abdominal aorta and its branches. These results were called by telephone at the time of interpretation on 05/05/2016 at 2:05 am to Dr. Jola Schmidt, who verbally acknowledged these results. Electronically Signed   By: Garald Balding M.D.   On: 05/05/2016 02:07    Anti-infectives: Anti-infectives    Start     Dose/Rate Route Frequency Ordered Stop   05/05/16 1200  ceFEPIme (MAXIPIME) 2 g in  dextrose 5 % 50 mL IVPB     2 g 100 mL/hr over 30 Minutes Intravenous Every 8 hours 05/05/16 0337     05/05/16 1200  metroNIDAZOLE (FLAGYL) IVPB 500 mg     500 mg 100 mL/hr over 60 Minutes Intravenous Every 8 hours 05/05/16 0337     05/05/16 0230  ceFEPIme (MAXIPIME) 2 g in dextrose 5 % 50 mL IVPB     2 g 100 mL/hr over 30 Minutes Intravenous  Once 05/05/16 0220 05/05/16 0300   05/05/16 0230  metroNIDAZOLE (FLAGYL) IVPB 500 mg     500 mg 100 mL/hr over 60 Minutes Intravenous  Once 05/05/16 0220 05/05/16 0404       Assessment/Plan Recurrent diverticulitis now with 3cm abscess - awaiting IR consult for possible drainage of abscess - continue IV antibiotics and pain control - continue NPO - continue fluid resuscitation - patient has been unable to urinate which may be due to dehydration, recommend bladder scan this afternoon if still not urinating - may benefit from a resection of her sigmoid colon, hopefully this will be able to be done on an elective basis but could be done during hospitalization if infection worsens or patient does not improve  Leukocytosis - 22.4 today, afebrile. Check labs in a.m. HTN - hydralazine PRN  ID - day 1 maxipime and flagyl VTE - lovenox, SCD's FEN - NPO  Plan - IR consult pending for drainage of abscess  LOS: 0 days    Jerrye Beavers , [facility] Surgery 05/05/2016, 9:10 AM Pager: [phone] Consults: [phone] Mon-Fri 7:00 am-4:30 pm Sat-Sun 7:00 am-11:30 am

## 2016-05-05 NOTE — Progress Notes (Signed)
Patient voided 476ml, clear dark urine. Did not I and OUt cath.

## 2016-05-05 NOTE — H&P (Signed)
Jasmine Cobb is an 50 y.o. female.   Chief Complaint: abdominal pain HPI: 50 yo female with 10 days of lower abdominal pain. Beginning last Saturday the pain became more and more severe. She also has radiation to her back. She went to the [facility] ER yesterday but left prior to full evaluation because of poor pain control. She has been having diarrhea described as yellow pudding. She has been vomiting multiple times a day and has poor PO intake for the past week. She has had multiple small bouts of diverticulitis over the last 6 years with 1 other hospitalization. She underwent colonoscopy 6 years ago with only positive finding being diverticulosis.  She has recently lost 40 pounds intentionally.  Past Medical History:  Diagnosis Date  . Diverticulitis   . Hypertension     Past Surgical History:  Procedure Laterality Date  . TUBAL LIGATION      No family history on file. Social History:  reports that she has quit smoking. Her smoking use included Cigarettes. She has never used smokeless tobacco. She reports that she drinks alcohol. She reports that she uses drugs, including Marijuana.  Allergies:  Allergies  Allergen Reactions  . Shellfish Allergy Anaphylaxis  . Ciprofloxacin Swelling  . Penicillins Other (See Comments)    unknown     (Not in a hospital admission)  Results for orders placed or performed during the hospital encounter of 05/04/16 (from the past 48 hour(s))  Lipase, blood     Status: None   Collection Time: 05/04/16  6:41 PM  Result Value Ref Range   Lipase 15 11 - 51 U/L  Comprehensive metabolic panel     Status: Abnormal   Collection Time: 05/04/16  6:41 PM  Result Value Ref Range   Sodium 137 135 - 145 mmol/L   Potassium 3.2 (L) 3.5 - 5.1 mmol/L   Chloride 105 101 - 111 mmol/L   CO2 20 (L) 22 - 32 mmol/L   Glucose, Bld 101 (H) 65 - 99 mg/dL   BUN <5 (L) 6 - 20 mg/dL   Creatinine, Ser 0.87 0.44 - 1.00 mg/dL   Calcium 9.2 8.9 - 10.3 mg/dL   Total  Protein 7.6 6.5 - 8.1 g/dL   Albumin 3.4 (L) 3.5 - 5.0 g/dL   AST 16 15 - 41 U/L   ALT 13 (L) 14 - 54 U/L   Alkaline Phosphatase 66 38 - 126 U/L   Total Bilirubin 0.9 0.3 - 1.2 mg/dL   GFR calc non Af Amer >60 >60 mL/min   GFR calc Af Amer >60 >60 mL/min    Comment: (NOTE) The eGFR has been calculated using the CKD EPI equation. This calculation has not been validated in all clinical situations. eGFR's persistently <60 mL/min signify possible Chronic Kidney Disease.    Anion gap 12 5 - 15  CBC     Status: Abnormal   Collection Time: 05/04/16  6:41 PM  Result Value Ref Range   WBC 20.5 (H) 4.0 - 10.5 K/uL   RBC 4.04 3.87 - 5.11 MIL/uL   Hemoglobin 13.3 12.0 - 15.0 g/dL   HCT 37.8 36.0 - 46.0 %   MCV 93.6 78.0 - 100.0 fL   MCH 32.9 26.0 - 34.0 pg   MCHC 35.2 30.0 - 36.0 g/dL   RDW 13.2 11.5 - 15.5 %   Platelets 349 150 - 400 K/uL  I-Stat beta hCG blood, ED     Status: None   Collection Time: 05/04/16  6:54  PM  Result Value Ref Range   I-stat hCG, quantitative <5.0 <5 mIU/mL   Comment 3            Comment:   GEST. AGE      CONC.  (mIU/mL)   <=1 WEEK        5 - 50     2 WEEKS       50 - 500     3 WEEKS       100 - 10,000     4 WEEKS     1,000 - 30,000        FEMALE AND NON-PREGNANT FEMALE:     LESS THAN 5 mIU/mL   Urinalysis, Routine w reflex microscopic     Status: Abnormal   Collection Time: 05/04/16  7:10 PM  Result Value Ref Range   Color, Urine YELLOW YELLOW   APPearance HAZY (A) CLEAR   Specific Gravity, Urine 1.031 (H) 1.005 - 1.030   pH 6.0 5.0 - 8.0   Glucose, UA NEGATIVE NEGATIVE mg/dL   Hgb urine dipstick NEGATIVE NEGATIVE   Bilirubin Urine SMALL (A) NEGATIVE   Ketones, ur >80 (A) NEGATIVE mg/dL   Protein, ur 30 (A) NEGATIVE mg/dL   Nitrite NEGATIVE NEGATIVE   Leukocytes, UA NEGATIVE NEGATIVE  Urine microscopic-add on     Status: Abnormal   Collection Time: 05/04/16  7:10 PM  Result Value Ref Range   Squamous Epithelial / LPF 6-30 (A) NONE SEEN   WBC,  UA 0-5 0 - 5 WBC/hpf   RBC / HPF 0-5 0 - 5 RBC/hpf   Bacteria, UA FEW (A) NONE SEEN   Ct Abdomen Pelvis W Contrast  Result Date: 05/05/2016 CLINICAL DATA:  Acute onset of lower abdominal pain. Initial encounter. EXAM: CT ABDOMEN AND PELVIS WITH CONTRAST TECHNIQUE: Multidetector CT imaging of the abdomen and pelvis was performed using the standard protocol following bolus administration of intravenous contrast. CONTRAST:  151m ISOVUE-300 IOPAMIDOL (ISOVUE-300) INJECTION 61% COMPARISON:  None. FINDINGS: Lower chest: The lung bases are grossly clear. The visualized portions of the mediastinum are unremarkable. Hepatobiliary: The liver is unremarkable in appearance. Layering increased attenuation within the gallbladder may reflect sludge. The common bile duct remains normal in caliber. Pancreas: The pancreatic duct measures up to 5 mm in diameter, of uncertain significance. The pancreas is otherwise unremarkable. Spleen: The spleen is unremarkable in appearance. Adrenals/Urinary Tract: The adrenal glands are within normal limits. The kidneys are unremarkable. There is no evidence of hydronephrosis. No renal or ureteral stones are identified. No perinephric stranding is seen. Stomach/Bowel: The stomach is grossly unremarkable in appearance. The small bowel is within normal limits. The appendix is not definitely seen; there is no evidence of appendicitis. Scattered diverticulosis is noted along the distal descending colon. There is diffuse wall thickening involving the mid sigmoid colon, with diffuse surrounding soft tissue inflammation and apparent focal wall abscess measuring 3.3 cm. There appears to be associated contained perforation extending superiorly from the wall abscess, with a contiguous focal abscess noted superior to the sigmoid colon, measuring 3.3 x 2.6 x 2.9 cm. Adjacent mildly prominent nodes are seen. This may reflect perforated diverticulitis or colitis. Vascular/Lymphatic: Mild scattered  calcification is seen along the distal abdominal aorta and its branches. No additional pelvic sidewall lymphadenopathy is seen. Reproductive: The uterus contains scattered calcifications, likely reflecting multiple small fibroids. The uterus is otherwise grossly unremarkable. The ovaries are relatively symmetric. No suspicious adnexal masses are seen. The bladder is mildly distended  and grossly unremarkable. Musculoskeletal: No acute osseous abnormalities are seen. The visualized musculature is unremarkable in appearance. Other: No additional soft tissue abnormalities are seen. IMPRESSION: 1. Diffuse wall thickening involving the mid sigmoid colon, with diffuse surrounding soft tissue inflammation and focal wall abscess measuring 3.3 cm. There appears to be associated contained perforation extending superiorly from the wall abscess, with contiguous focal abscess superior to the sigmoid colon, measuring 3.3 x 2.6 x 2.9 cm. Adjacent mildly prominent nodes seen. This may reflect perforated diverticulitis or colitis. 2. Multiple small calcified uterine fibroids seen. 3. Pancreatic duct is dilated to 5 mm, of uncertain significance. Would correlate with pancreatic lab values, and consider MRCP for further evaluation as deemed clinically appropriate. 4. Layering increased attenuation within the gallbladder may reflect sludge. Gallbladder otherwise unremarkable. 5. Scattered diverticulosis along the distal descending colon. 6. Mild scattered calcification along the distal abdominal aorta and its branches. These results were called by telephone at the time of interpretation on 05/05/2016 at 2:05 am to Dr. Jola Schmidt, who verbally acknowledged these results. Electronically Signed   By: Garald Balding M.D.   On: 05/05/2016 02:07    Review of Systems  Constitutional: Positive for weight loss. Negative for chills and fever.  HENT: Negative for hearing loss.   Eyes: Negative for blurred vision and double vision.   Respiratory: Negative for cough and hemoptysis.   Cardiovascular: Negative for chest pain and palpitations.  Gastrointestinal: Positive for abdominal pain, diarrhea, nausea and vomiting.  Genitourinary: Negative for dysuria and urgency.  Musculoskeletal: Negative for myalgias and neck pain.  Skin: Negative for itching and rash.  Neurological: Negative for dizziness, tingling and headaches.  Endo/Heme/Allergies: Does not bruise/bleed easily.  Psychiatric/Behavioral: Negative for depression and suicidal ideas.    Blood pressure 148/89, pulse 72, temperature 98.4 F (36.9 C), temperature source Oral, resp. rate 16, last menstrual period 04/21/2016, SpO2 93 %. Physical Exam  Nursing note and vitals reviewed. Constitutional: She is oriented to person, place, and time. She appears well-developed and well-nourished.  HENT:  Head: Normocephalic and atraumatic.  Eyes: Conjunctivae and EOM are normal. No scleral icterus.  Neck: Normal range of motion. Neck supple.  Cardiovascular: Normal rate and regular rhythm.   Respiratory: Effort normal and breath sounds normal. She has no wheezes. She has no rales. She exhibits no tenderness.  GI: Soft. She exhibits no distension. There is tenderness in the suprapubic area and left lower quadrant. There is no rebound.  Musculoskeletal: Normal range of motion. She exhibits no edema.  Neurological: She is alert and oriented to person, place, and time.  Skin: Skin is warm and dry.  Psychiatric: She has a normal mood and affect. Her behavior is normal.     Assessment/Plan 50 yo female with recurrent diverticulitis now with 3cm abscess -admit for IV abx -IR to evaluate in am for possible drain -NPO -pain control -rehydrate -We discussed that with her recurrent disease she would likely benefit from a resection of her sigmoid colon. It is possible that it may be necessary during this hospitalization but otherwise could be done on a more elective  basis.  Mickeal Skinner, MD 05/05/2016, 3:28 AM

## 2016-05-05 NOTE — Procedures (Signed)
Interventional Radiology Procedure Note  Procedure: Placement of a pelvic drain, into abscess, via CT guidance.  20F drain to gravity.  Complications: None Recommendations:  - Record output per shift. - Follow culture - BID-TID flushes, with 5-10cc sterile saline - Routine wound care  Signed,  Dulcy Fanny. Earleen Newport, DO

## 2016-05-05 NOTE — ED Notes (Signed)
Patient transported to CT 

## 2016-05-06 LAB — BASIC METABOLIC PANEL
Anion gap: 9 (ref 5–15)
CHLORIDE: 109 mmol/L (ref 101–111)
CO2: 22 mmol/L (ref 22–32)
CREATININE: 0.73 mg/dL (ref 0.44–1.00)
Calcium: 8.3 mg/dL — ABNORMAL LOW (ref 8.9–10.3)
GFR calc Af Amer: >60 mL/min (ref >60)
GFR calc non Af Amer: >60 mL/min (ref >60)
GLUCOSE: 120 mg/dL — AB (ref 65–99)
POTASSIUM: 3.5 mmol/L (ref 3.5–5.1)
SODIUM: 140 mmol/L (ref 135–145)

## 2016-05-06 LAB — CBC
HEMATOCRIT: 34.8 % — AB (ref 36.0–46.0)
Hemoglobin: 11.6 g/dL — ABNORMAL LOW (ref 12.0–15.0)
MCH: 32.3 pg (ref 26.0–34.0)
MCHC: 33.3 g/dL (ref 30.0–36.0)
MCV: 96.9 fL (ref 78.0–100.0)
PLATELETS: 286 10*3/uL (ref 150–400)
RBC: 3.59 MIL/uL — ABNORMAL LOW (ref 3.87–5.11)
RDW: 13.4 % (ref 11.5–15.5)
WBC: 14.3 10*3/uL — AB (ref 4.0–10.5)

## 2016-05-06 NOTE — Progress Notes (Signed)
Referring Physician(s): Dr Lyman Bishop  Supervising Physician: Corrie Mckusick  Patient Status:  Inpatient  Chief Complaint:  Diverticular abscess Drain placed 9/8  Subjective:  Better already Less pain Up in room  Allergies: Shellfish allergy; Ciprofloxacin; and Penicillins  Medications: Prior to Admission medications   Medication Sig Start Date End Date Taking? Authorizing Provider  LINZESS 145 MCG CAPS capsule Take 145 mcg by mouth daily. 04/25/16  Yes Historical Provider, MD  losartan (COZAAR) 25 MG tablet Take 25 mg by mouth daily. 01/28/16  Yes Historical Provider, MD     Vital Signs: BP 135/85 (BP Location: Right Arm)   Pulse 69   Temp 98.4 F (36.9 C) (Oral)   Resp 16   LMP 04/21/2016   SpO2 100%   Physical Exam  Abdominal: Soft. Bowel sounds are normal. There is no tenderness.  Skin: Skin is warm and dry.  Site of pelvic abscess drain is clean and dry NT No bleeding afeb Wbc down--14.3  No record of output 20 cc in bag: cloudy/blood tinged  Nursing note and vitals reviewed.   Imaging: Ct Abdomen Pelvis W Contrast  Result Date: 05/05/2016 CLINICAL DATA:  Acute onset of lower abdominal pain. Initial encounter. EXAM: CT ABDOMEN AND PELVIS WITH CONTRAST TECHNIQUE: Multidetector CT imaging of the abdomen and pelvis was performed using the standard protocol following bolus administration of intravenous contrast. CONTRAST:  135mL ISOVUE-300 IOPAMIDOL (ISOVUE-300) INJECTION 61% COMPARISON:  None. FINDINGS: Lower chest: The lung bases are grossly clear. The visualized portions of the mediastinum are unremarkable. Hepatobiliary: The liver is unremarkable in appearance. Layering increased attenuation within the gallbladder may reflect sludge. The common bile duct remains normal in caliber. Pancreas: The pancreatic duct measures up to 5 mm in diameter, of uncertain significance. The pancreas is otherwise unremarkable. Spleen: The spleen is unremarkable in appearance.  Adrenals/Urinary Tract: The adrenal glands are within normal limits. The kidneys are unremarkable. There is no evidence of hydronephrosis. No renal or ureteral stones are identified. No perinephric stranding is seen. Stomach/Bowel: The stomach is grossly unremarkable in appearance. The small bowel is within normal limits. The appendix is not definitely seen; there is no evidence of appendicitis. Scattered diverticulosis is noted along the distal descending colon. There is diffuse wall thickening involving the mid sigmoid colon, with diffuse surrounding soft tissue inflammation and apparent focal wall abscess measuring 3.3 cm. There appears to be associated contained perforation extending superiorly from the wall abscess, with a contiguous focal abscess noted superior to the sigmoid colon, measuring 3.3 x 2.6 x 2.9 cm. Adjacent mildly prominent nodes are seen. This may reflect perforated diverticulitis or colitis. Vascular/Lymphatic: Mild scattered calcification is seen along the distal abdominal aorta and its branches. No additional pelvic sidewall lymphadenopathy is seen. Reproductive: The uterus contains scattered calcifications, likely reflecting multiple small fibroids. The uterus is otherwise grossly unremarkable. The ovaries are relatively symmetric. No suspicious adnexal masses are seen. The bladder is mildly distended and grossly unremarkable. Musculoskeletal: No acute osseous abnormalities are seen. The visualized musculature is unremarkable in appearance. Other: No additional soft tissue abnormalities are seen. IMPRESSION: 1. Diffuse wall thickening involving the mid sigmoid colon, with diffuse surrounding soft tissue inflammation and focal wall abscess measuring 3.3 cm. There appears to be associated contained perforation extending superiorly from the wall abscess, with contiguous focal abscess superior to the sigmoid colon, measuring 3.3 x 2.6 x 2.9 cm. Adjacent mildly prominent nodes seen. This may  reflect perforated diverticulitis or colitis. 2. Multiple small calcified  uterine fibroids seen. 3. Pancreatic duct is dilated to 5 mm, of uncertain significance. Would correlate with pancreatic lab values, and consider MRCP for further evaluation as deemed clinically appropriate. 4. Layering increased attenuation within the gallbladder may reflect sludge. Gallbladder otherwise unremarkable. 5. Scattered diverticulosis along the distal descending colon. 6. Mild scattered calcification along the distal abdominal aorta and its branches. These results were called by telephone at the time of interpretation on 05/05/2016 at 2:05 am to Dr. Jola Schmidt, who verbally acknowledged these results. Electronically Signed   By: Garald Balding M.D.   On: 05/05/2016 02:07   Ct Image Guided Drainage By Percutaneous Catheter  Result Date: 05/05/2016 INDICATION: 50 year old female with a history of diverticulitis and abscess of the pelvis. EXAM: CT GUIDED DRAINAGE OF PELVIC ABSCESS MEDICATIONS: The patient is currently admitted to the hospital and receiving intravenous antibiotics. The antibiotics were administered within an appropriate time frame prior to the initiation of the procedure. ANESTHESIA/SEDATION: 4.0 mg IV Versed 200 mcg IV Fentanyl Moderate Sedation Time:  25 The patient was continuously monitored during the procedure by the interventional radiology nurse under my direct supervision. COMPLICATIONS: None TECHNIQUE: Informed written consent was obtained from the patient after a thorough discussion of the procedural risks, benefits and alternatives. All questions were addressed. Maximal Sterile Barrier Technique was utilized including caps, mask, sterile gowns, sterile gloves, sterile drape, hand hygiene and skin antiseptic. A timeout was performed prior to the initiation of the procedure. PROCEDURE: Patient positioned supine position on CT gantry table. Scout CT of the abdomen/pelvis performed for planning purposes.  Patient was then prepped and draped in the usual sterile fashion. The skin and subcutaneous tissues were generously infiltrated 1% lidocaine for local anesthesia. A small stab incision was made with 11 blade scalpel. Using CT guidance, a trocar needle was advanced over the left iliac bone into the pelvis. Approximately 60 cc of saline infused at the level of the sigmoid colon for hydro dissection/displacement of the colon. Once the needle was in a reasonable trajectory towards the pelvic abscess, modified Seldinger technique was used to advance a curved 20 cm Chiba needle into the abscess. The trocar needle was then advanced over the [facility] needle, confirmed in position by CT imaging. After removal of the Chiba needle purulent fluid spontaneously returned confirming position. 035 wire was advanced into the abscess cavity and the needle was removed. Soft tissue dilation was performed and then a 10 French drain catheter was placed into the pelvic abscess. Pigtail catheter was formed and sutured in position. Final image was stored. Sample of purulent fluid was sent to the lab for analysis. Patient tolerated the procedure well and remained hemodynamically stable throughout. No complications were encountered and no significant blood loss encountered. FINDINGS: Initial CT image demonstrates abscess within the pelvic cavity, adjacent to distal sigmoid colon and the rectosigmoid junction. Persistent inflammatory changes of the mesenteric fat and of the sigmoid colon. Final image demonstrates placement of 10 French pigtail catheter into the abscess cavity. IMPRESSION: Status post CT-guided drain placement into pelvic abscess, with a sample sent to the lab for analysis. Signed, Dulcy Fanny. Earleen Newport, DO [facility] Radiology Electronically Signed   By: Corrie Mckusick D.O.   On: 05/05/2016 16:48    Labs:  CBC:  Recent Labs  05/04/16 1841 05/05/16 0335 05/06/16 0224  WBC  20.5* 22.4* 14.3*  HGB 13.3 11.7* 11.6*  HCT 37.8 34.9* 34.8*  PLT 349 296 286    COAGS:  Recent Labs  05/05/16 0708  INR 1.12  APTT 37*    BMP:  Recent Labs  05/04/16 1841 05/05/16 0335 05/06/16 0224  NA 137 139 140  K 3.2* 3.6 3.5  CL 105 107 109  CO2 20* 22 22  GLUCOSE 101* 117* 120*  BUN <5* <5* <5*  CALCIUM 9.2 8.3* 8.3*  CREATININE 0.87 0.80 0.73  GFRNONAA >60 >60 >60  GFRAA >60 >60 >60    LIVER FUNCTION TESTS:  Recent Labs  05/04/16 1841  BILITOT 0.9  AST 16  ALT 13*  ALKPHOS 66  PROT 7.6  ALBUMIN 3.4*    Assessment and Plan:  Diverticular abscess Pelvic abs drain intact Will follow Plan per [facility]  Electronically Signed: Jeter Tomey A 05/06/2016, 1:03 PM   I spent a total of 15 Minutes at the the patient's bedside AND on the patient's hospital floor or unit, greater than 50% of which was counseling/coordinating care for pelvic abs drain

## 2016-05-06 NOTE — Progress Notes (Signed)
Subjective: Pt doing well.  Less abd pain IR drain placed  Objective: Vital signs in last 24 hours: Temp:  [98.3 F (36.8 C)-98.9 F (37.2 C)] 98.4 F (36.9 C) (09/09 0919) Pulse Rate:  [69-83] 69 (09/09 0919) Resp:  [12-16] 16 (09/09 0445) BP: (120-179)/(68-90) 135/85 (09/09 0919) SpO2:  [94 %-100 %] 100 % (09/09 0919) Last BM Date: 05/05/16  Intake/Output from previous day: 09/08 0701 - 09/09 0700 In: 3655 [I.V.:3325; IV Piggyback:300] Out: 1600 [Urine:1600] Intake/Output this shift: No intake/output data recorded.  General appearance: alert and cooperative GI: soft, ttp LLQ  Lab Results:   Recent Labs  05/05/16 0335 05/06/16 0224  WBC 22.4* 14.3*  HGB 11.7* 11.6*  HCT 34.9* 34.8*  PLT 296 286   BMET  Recent Labs  05/05/16 0335 05/06/16 0224  NA 139 140  K 3.6 3.5  CL 107 109  CO2 22 22  GLUCOSE 117* 120*  BUN <5* <5*  CREATININE 0.80 0.73  CALCIUM 8.3* 8.3*   PT/INR  Recent Labs  05/05/16 0708  LABPROT 14.5  INR 1.12   ABG No results for input(s): PHART, HCO3 in the last 72 hours.  Invalid input(s): PCO2, PO2  Studies/Results: Ct Abdomen Pelvis W Contrast  Result Date: 05/05/2016 CLINICAL DATA:  Acute onset of lower abdominal pain. Initial encounter. EXAM: CT ABDOMEN AND PELVIS WITH CONTRAST TECHNIQUE: Multidetector CT imaging of the abdomen and pelvis was performed using the standard protocol following bolus administration of intravenous contrast. CONTRAST:  161mL ISOVUE-300 IOPAMIDOL (ISOVUE-300) INJECTION 61% COMPARISON:  None. FINDINGS: Lower chest: The lung bases are grossly clear. The visualized portions of the mediastinum are unremarkable. Hepatobiliary: The liver is unremarkable in appearance. Layering increased attenuation within the gallbladder may reflect sludge. The common bile duct remains normal in caliber. Pancreas: The pancreatic duct measures up to 5 mm in diameter, of uncertain significance. The pancreas is otherwise  unremarkable. Spleen: The spleen is unremarkable in appearance. Adrenals/Urinary Tract: The adrenal glands are within normal limits. The kidneys are unremarkable. There is no evidence of hydronephrosis. No renal or ureteral stones are identified. No perinephric stranding is seen. Stomach/Bowel: The stomach is grossly unremarkable in appearance. The small bowel is within normal limits. The appendix is not definitely seen; there is no evidence of appendicitis. Scattered diverticulosis is noted along the distal descending colon. There is diffuse wall thickening involving the mid sigmoid colon, with diffuse surrounding soft tissue inflammation and apparent focal wall abscess measuring 3.3 cm. There appears to be associated contained perforation extending superiorly from the wall abscess, with a contiguous focal abscess noted superior to the sigmoid colon, measuring 3.3 x 2.6 x 2.9 cm. Adjacent mildly prominent nodes are seen. This may reflect perforated diverticulitis or colitis. Vascular/Lymphatic: Mild scattered calcification is seen along the distal abdominal aorta and its branches. No additional pelvic sidewall lymphadenopathy is seen. Reproductive: The uterus contains scattered calcifications, likely reflecting multiple small fibroids. The uterus is otherwise grossly unremarkable. The ovaries are relatively symmetric. No suspicious adnexal masses are seen. The bladder is mildly distended and grossly unremarkable. Musculoskeletal: No acute osseous abnormalities are seen. The visualized musculature is unremarkable in appearance. Other: No additional soft tissue abnormalities are seen. IMPRESSION: 1. Diffuse wall thickening involving the mid sigmoid colon, with diffuse surrounding soft tissue inflammation and focal wall abscess measuring 3.3 cm. There appears to be associated contained perforation extending superiorly from the wall abscess, with contiguous focal abscess superior to the sigmoid colon, measuring 3.3 x  2.6 x 2.9  cm. Adjacent mildly prominent nodes seen. This may reflect perforated diverticulitis or colitis. 2. Multiple small calcified uterine fibroids seen. 3. Pancreatic duct is dilated to 5 mm, of uncertain significance. Would correlate with pancreatic lab values, and consider MRCP for further evaluation as deemed clinically appropriate. 4. Layering increased attenuation within the gallbladder may reflect sludge. Gallbladder otherwise unremarkable. 5. Scattered diverticulosis along the distal descending colon. 6. Mild scattered calcification along the distal abdominal aorta and its branches. These results were called by telephone at the time of interpretation on 05/05/2016 at 2:05 am to Dr. Jola Schmidt, who verbally acknowledged these results. Electronically Signed   By: Garald Balding M.D.   On: 05/05/2016 02:07   Ct Image Guided Drainage By Percutaneous Catheter  Result Date: 05/05/2016 INDICATION: 50 year old female with a history of diverticulitis and abscess of the pelvis. EXAM: CT GUIDED DRAINAGE OF PELVIC ABSCESS MEDICATIONS: The patient is currently admitted to the hospital and receiving intravenous antibiotics. The antibiotics were administered within an appropriate time frame prior to the initiation of the procedure. ANESTHESIA/SEDATION: 4.0 mg IV Versed 200 mcg IV Fentanyl Moderate Sedation Time:  25 The patient was continuously monitored during the procedure by the interventional radiology nurse under my direct supervision. COMPLICATIONS: None TECHNIQUE: Informed written consent was obtained from the patient after a thorough discussion of the procedural risks, benefits and alternatives. All questions were addressed. Maximal Sterile Barrier Technique was utilized including caps, mask, sterile gowns, sterile gloves, sterile drape, hand hygiene and skin antiseptic. A timeout was performed prior to the initiation of the procedure. PROCEDURE: Patient positioned supine position on CT gantry table. Scout  CT of the abdomen/pelvis performed for planning purposes. Patient was then prepped and draped in the usual sterile fashion. The skin and subcutaneous tissues were generously infiltrated 1% lidocaine for local anesthesia. A small stab incision was made with 11 blade scalpel. Using CT guidance, a trocar needle was advanced over the left iliac bone into the pelvis. Approximately 60 cc of saline infused at the level of the sigmoid colon for hydro dissection/displacement of the colon. Once the needle was in a reasonable trajectory towards the pelvic abscess, modified Seldinger technique was used to advance a curved 20 cm Chiba needle into the abscess. The trocar needle was then advanced over the [facility] needle, confirmed in position by CT imaging. After removal of the Chiba needle purulent fluid spontaneously returned confirming position. 035 wire was advanced into the abscess cavity and the needle was removed. Soft tissue dilation was performed and then a 10 French drain catheter was placed into the pelvic abscess. Pigtail catheter was formed and sutured in position. Final image was stored. Sample of purulent fluid was sent to the lab for analysis. Patient tolerated the procedure well and remained hemodynamically stable throughout. No complications were encountered and no significant blood loss encountered. FINDINGS: Initial CT image demonstrates abscess within the pelvic cavity, adjacent to distal sigmoid colon and the rectosigmoid junction. Persistent inflammatory changes of the mesenteric fat and of the sigmoid colon. Final image demonstrates placement of 10 French pigtail catheter into the abscess cavity. IMPRESSION: Status post CT-guided drain placement into pelvic abscess, with a sample sent to the lab for analysis. Signed, Dulcy Fanny. Earleen Newport, DO [facility] Radiology Electronically Signed   By: Corrie Mckusick D.O.   On: 05/05/2016 16:48     Anti-infectives: Anti-infectives    Start     Dose/Rate Route Frequency Ordered Stop   05/05/16 1200  ceFEPIme (  MAXIPIME) 2 g in dextrose 5 % 50 mL IVPB     2 g 100 mL/hr over 30 Minutes Intravenous Every 8 hours 05/05/16 0337     05/05/16 1200  metroNIDAZOLE (FLAGYL) IVPB 500 mg     500 mg 100 mL/hr over 60 Minutes Intravenous Every 8 hours 05/05/16 0337     05/05/16 0230  ceFEPIme (MAXIPIME) 2 g in dextrose 5 % 50 mL IVPB     2 g 100 mL/hr over 30 Minutes Intravenous  Once 05/05/16 0220 05/05/16 0300   05/05/16 0230  metroNIDAZOLE (FLAGYL) IVPB 500 mg     500 mg 100 mL/hr over 60 Minutes Intravenous  Once 05/05/16 0220 05/05/16 0404      Assessment/Plan: 50 yo female with recurrent diverticulitis now with 3cm abscess -s/p IR drain -WBC count trending down approp -mobilizing  well  LOS: 1 day    Rosario Jacks., Anne Hahn 05/06/2016

## 2016-05-07 NOTE — Progress Notes (Signed)
Subjective: Pt doing well this AM. States she's less sore  Objective: Vital signs in last 24 hours: Temp:  [97.6 F (36.4 C)-98.3 F (36.8 C)] 97.6 F (36.4 C) (09/10 0451) Pulse Rate:  [66-70] 66 (09/10 0451) Resp:  [16-19] 19 (09/10 0451) BP: (137-142)/(67-82) 137/67 (09/10 0451) SpO2:  [100 %] 100 % (09/10 0451) Weight:  [83 kg (183 lb)] 83 kg (183 lb) (09/10 0800) Last BM Date: 05/06/16  Intake/Output from previous day: 09/09 0701 - 09/10 0700 In: 1912.5 [I.V.:1912.5] Out: 1226 [Urine:1200; Drains:25; Stool:1] Intake/Output this shift: No intake/output data recorded.  General appearance: alert and cooperative GI: soft, min ttp LLQ, min output in drain  Lab Results:   Recent Labs  05/05/16 0335 05/06/16 0224  WBC 22.4* 14.3*  HGB 11.7* 11.6*  HCT 34.9* 34.8*  PLT 296 286   BMET  Recent Labs  05/05/16 0335 05/06/16 0224  NA 139 140  K 3.6 3.5  CL 107 109  CO2 22 22  GLUCOSE 117* 120*  BUN <5* <5*  CREATININE 0.80 0.73  CALCIUM 8.3* 8.3*   PT/INR  Recent Labs  05/05/16 0708  LABPROT 14.5  INR 1.12   ABG No results for input(s): PHART, HCO3 in the last 72 hours.  Invalid input(s): PCO2, PO2  Studies/Results: Ct Image Guided Drainage By Percutaneous Catheter  Result Date: 05/05/2016 INDICATION: 50 year old female with a history of diverticulitis and abscess of the pelvis. EXAM: CT GUIDED DRAINAGE OF PELVIC ABSCESS MEDICATIONS: The patient is currently admitted to the hospital and receiving intravenous antibiotics. The antibiotics were administered within an appropriate time frame prior to the initiation of the procedure. ANESTHESIA/SEDATION: 4.0 mg IV Versed 200 mcg IV Fentanyl Moderate Sedation Time:  25 The patient was continuously monitored during the procedure by the interventional radiology nurse under my direct supervision. COMPLICATIONS: None TECHNIQUE: Informed written consent was obtained from the patient after a thorough discussion of  the procedural risks, benefits and alternatives. All questions were addressed. Maximal Sterile Barrier Technique was utilized including caps, mask, sterile gowns, sterile gloves, sterile drape, hand hygiene and skin antiseptic. A timeout was performed prior to the initiation of the procedure. PROCEDURE: Patient positioned supine position on CT gantry table. Scout CT of the abdomen/pelvis performed for planning purposes. Patient was then prepped and draped in the usual sterile fashion. The skin and subcutaneous tissues were generously infiltrated 1% lidocaine for local anesthesia. A small stab incision was made with 11 blade scalpel. Using CT guidance, a trocar needle was advanced over the left iliac bone into the pelvis. Approximately 60 cc of saline infused at the level of the sigmoid colon for hydro dissection/displacement of the colon. Once the needle was in a reasonable trajectory towards the pelvic abscess, modified Seldinger technique was used to advance a curved 20 cm Chiba needle into the abscess. The trocar needle was then advanced over the [facility] needle, confirmed in position by CT imaging. After removal of the Chiba needle purulent fluid spontaneously returned confirming position. 035 wire was advanced into the abscess cavity and the needle was removed. Soft tissue dilation was performed and then a 10 French drain catheter was placed into the pelvic abscess. Pigtail catheter was formed and sutured in position. Final image was stored. Sample of purulent fluid was sent to the lab for analysis. Patient tolerated the procedure well and remained hemodynamically stable throughout. No complications were encountered and no significant blood loss encountered. FINDINGS: Initial CT image demonstrates abscess within the pelvic cavity, adjacent to  distal sigmoid colon and the rectosigmoid junction. Persistent inflammatory changes of the mesenteric fat and of the sigmoid colon. Final image demonstrates placement of 10  French pigtail catheter into the abscess cavity. IMPRESSION: Status post CT-guided drain placement into pelvic abscess, with a sample sent to the lab for analysis. Signed, Dulcy Fanny. Earleen Newport, DO [facility] Radiology Electronically Signed   By: Corrie Mckusick D.O.   On: 05/05/2016 16:48    Anti-infectives: Anti-infectives    Start     Dose/Rate Route Frequency Ordered Stop   05/05/16 1200  ceFEPIme (MAXIPIME) 2 g in dextrose 5 % 50 mL IVPB     2 g 100 mL/hr over 30 Minutes Intravenous Every 8 hours 05/05/16 0337     05/05/16 1200  metroNIDAZOLE (FLAGYL) IVPB 500 mg     500 mg 100 mL/hr over 60 Minutes Intravenous Every 8 hours 05/05/16 0337     05/05/16 0230  ceFEPIme (MAXIPIME) 2 g in dextrose 5 % 50 mL IVPB     2 g 100 mL/hr over 30 Minutes Intravenous  Once 05/05/16 0220 05/05/16 0300   05/05/16 0230  metroNIDAZOLE (FLAGYL) IVPB 500 mg     500 mg 100 mL/hr over 60 Minutes Intravenous  Once 05/05/16 0220 05/05/16 0404      Assessment/Plan: 50 yo female with recurrent diverticulitis now with 3cm abscess  Advance diet to Clears Cont' abx mobilize  LOS: 2 days    Rosario Jacks., Anne Hahn 05/07/2016

## 2016-05-08 MED ORDER — SODIUM CHLORIDE 0.9 % IV SOLN
1000.0000 mL | INTRAVENOUS | Status: DC
  Administered 2016-05-08: 1000 mL via INTRAVENOUS

## 2016-05-08 NOTE — Progress Notes (Signed)
  Subjective: Tolerated clears Passing flatus Minimal abdominal pain   Objective: Vital signs in last 24 hours: Temp:  [98.2 F (36.8 C)-98.6 F (37 C)] (P) 98.2 F (36.8 C) (09/11 0703) Pulse Rate:  [59-70] (P) 69 (09/11 0703) Resp:  [18-19] (P) 19 (09/11 0703) BP: (132-142)/(88-94) 132/94 (09/10 1900) SpO2:  [100 %] 100 % (09/10 1900) Last BM Date: 05/06/16  Intake/Output from previous day: 09/10 0701 - 09/11 0700 In: 8388.3 [P.O.:720; I.V.:6608.3; IV Piggyback:1050] Out: [id number] [Urine:4200; Drains:25] Intake/Output this shift: No intake/output data recorded.  Abdomen soft with minimal LLQ tenderness Drain serous  Lab Results:   Recent Labs  05/06/16 0224  WBC 14.3*  HGB 11.6*  HCT 34.8*  PLT 286   BMET  Recent Labs  05/06/16 0224  NA 140  K 3.5  CL 109  CO2 22  GLUCOSE 120*  BUN <5*  CREATININE 0.73  CALCIUM 8.3*   PT/INR No results for input(s): LABPROT, INR in the last 72 hours. ABG No results for input(s): PHART, HCO3 in the last 72 hours.  Invalid input(s): PCO2, PO2  Studies/Results: No results found.  Anti-infectives: Anti-infectives    Start     Dose/Rate Route Frequency Ordered Stop   05/05/16 1200  ceFEPIme (MAXIPIME) 2 g in dextrose 5 % 50 mL IVPB     2 g 100 mL/hr over 30 Minutes Intravenous Every 8 hours 05/05/16 0337     05/05/16 1200  metroNIDAZOLE (FLAGYL) IVPB 500 mg     500 mg 100 mL/hr over 60 Minutes Intravenous Every 8 hours 05/05/16 0337     05/05/16 0230  ceFEPIme (MAXIPIME) 2 g in dextrose 5 % 50 mL IVPB     2 g 100 mL/hr over 30 Minutes Intravenous  Once 05/05/16 0220 05/05/16 0300   05/05/16 0230  metroNIDAZOLE (FLAGYL) IVPB 500 mg     500 mg 100 mL/hr over 60 Minutes Intravenous  Once 05/05/16 0220 05/05/16 0404      Assessment/Plan: Diverticulitis with abscess s/p IR drain  Will check WBC tomorrow.  If normal, will change to oral antibiotics. Consider repeat CT tomorrow or Wed Full liquid diet   LOS:  3 days    Jobin Montelongo A 05/08/2016

## 2016-05-08 NOTE — Progress Notes (Signed)
Patient ID: Jasmine Cobb, female   DOB: 1966/03/30, 50 y.o.   MRN: [id number]    Referring Physician(s): Ramirez,A  Supervising Physician: Sandi Mariscal  Patient Status:  Inpatient  Chief Complaint:  Diverticular abscess  Subjective:  Pt doing ok; ambulating; has some mild LLQ soreness; anxious to advance diet  Allergies: Shellfish allergy; Ciprofloxacin; and Penicillins  Medications: Prior to Admission medications   Medication Sig Start Date End Date Taking? Authorizing Provider  LINZESS 145 MCG CAPS capsule Take 145 mcg by mouth daily. 04/25/16  Yes Historical Provider, MD  losartan (COZAAR) 25 MG tablet Take 25 mg by mouth daily. 01/28/16  Yes Historical Provider, MD     Vital Signs: BP (!) 132/94 (BP Location: Left Arm)   Pulse (P) 69   Temp (P) 98.2 F (36.8 C) (Oral)   Resp (P) 19   Ht 5\' 5"  (1.651 m)   Wt 183 lb (83 kg)   LMP 04/21/2016   SpO2 100%   BMI 30.45 kg/m   Physical Exam LLQ drain intact, output 25 cc light brown fluid, drain flushed with little return; insertion site ok; cx's- group C strept  Imaging: Ct Abdomen Pelvis W Contrast  Result Date: 05/05/2016 CLINICAL DATA:  Acute onset of lower abdominal pain. Initial encounter. EXAM: CT ABDOMEN AND PELVIS WITH CONTRAST TECHNIQUE: Multidetector CT imaging of the abdomen and pelvis was performed using the standard protocol following bolus administration of intravenous contrast. CONTRAST:  156mL ISOVUE-300 IOPAMIDOL (ISOVUE-300) INJECTION 61% COMPARISON:  None. FINDINGS: Lower chest: The lung bases are grossly clear. The visualized portions of the mediastinum are unremarkable. Hepatobiliary: The liver is unremarkable in appearance. Layering increased attenuation within the gallbladder may reflect sludge. The common bile duct remains normal in caliber. Pancreas: The pancreatic duct measures up to 5 mm in diameter, of uncertain significance. The pancreas is otherwise unremarkable. Spleen: The spleen is unremarkable  in appearance. Adrenals/Urinary Tract: The adrenal glands are within normal limits. The kidneys are unremarkable. There is no evidence of hydronephrosis. No renal or ureteral stones are identified. No perinephric stranding is seen. Stomach/Bowel: The stomach is grossly unremarkable in appearance. The small bowel is within normal limits. The appendix is not definitely seen; there is no evidence of appendicitis. Scattered diverticulosis is noted along the distal descending colon. There is diffuse wall thickening involving the mid sigmoid colon, with diffuse surrounding soft tissue inflammation and apparent focal wall abscess measuring 3.3 cm. There appears to be associated contained perforation extending superiorly from the wall abscess, with a contiguous focal abscess noted superior to the sigmoid colon, measuring 3.3 x 2.6 x 2.9 cm. Adjacent mildly prominent nodes are seen. This may reflect perforated diverticulitis or colitis. Vascular/Lymphatic: Mild scattered calcification is seen along the distal abdominal aorta and its branches. No additional pelvic sidewall lymphadenopathy is seen. Reproductive: The uterus contains scattered calcifications, likely reflecting multiple small fibroids. The uterus is otherwise grossly unremarkable. The ovaries are relatively symmetric. No suspicious adnexal masses are seen. The bladder is mildly distended and grossly unremarkable. Musculoskeletal: No acute osseous abnormalities are seen. The visualized musculature is unremarkable in appearance. Other: No additional soft tissue abnormalities are seen. IMPRESSION: 1. Diffuse wall thickening involving the mid sigmoid colon, with diffuse surrounding soft tissue inflammation and focal wall abscess measuring 3.3 cm. There appears to be associated contained perforation extending superiorly from the wall abscess, with contiguous focal abscess superior to the sigmoid colon, measuring 3.3 x 2.6 x 2.9 cm. Adjacent mildly prominent nodes  seen. This may  reflect perforated diverticulitis or colitis. 2. Multiple small calcified uterine fibroids seen. 3. Pancreatic duct is dilated to 5 mm, of uncertain significance. Would correlate with pancreatic lab values, and consider MRCP for further evaluation as deemed clinically appropriate. 4. Layering increased attenuation within the gallbladder may reflect sludge. Gallbladder otherwise unremarkable. 5. Scattered diverticulosis along the distal descending colon. 6. Mild scattered calcification along the distal abdominal aorta and its branches. These results were called by telephone at the time of interpretation on 05/05/2016 at 2:05 am to Dr. Jola Schmidt, who verbally acknowledged these results. Electronically Signed   By: Garald Balding M.D.   On: 05/05/2016 02:07   Ct Image Guided Drainage By Percutaneous Catheter  Result Date: 05/05/2016 INDICATION: 50 year old female with a history of diverticulitis and abscess of the pelvis. EXAM: CT GUIDED DRAINAGE OF PELVIC ABSCESS MEDICATIONS: The patient is currently admitted to the hospital and receiving intravenous antibiotics. The antibiotics were administered within an appropriate time frame prior to the initiation of the procedure. ANESTHESIA/SEDATION: 4.0 mg IV Versed 200 mcg IV Fentanyl Moderate Sedation Time:  25 The patient was continuously monitored during the procedure by the interventional radiology nurse under my direct supervision. COMPLICATIONS: None TECHNIQUE: Informed written consent was obtained from the patient after a thorough discussion of the procedural risks, benefits and alternatives. All questions were addressed. Maximal Sterile Barrier Technique was utilized including caps, mask, sterile gowns, sterile gloves, sterile drape, hand hygiene and skin antiseptic. A timeout was performed prior to the initiation of the procedure. PROCEDURE: Patient positioned supine position on CT gantry table. Scout CT of the abdomen/pelvis performed for planning  purposes. Patient was then prepped and draped in the usual sterile fashion. The skin and subcutaneous tissues were generously infiltrated 1% lidocaine for local anesthesia. A small stab incision was made with 11 blade scalpel. Using CT guidance, a trocar needle was advanced over the left iliac bone into the pelvis. Approximately 60 cc of saline infused at the level of the sigmoid colon for hydro dissection/displacement of the colon. Once the needle was in a reasonable trajectory towards the pelvic abscess, modified Seldinger technique was used to advance a curved 20 cm Chiba needle into the abscess. The trocar needle was then advanced over the [facility] needle, confirmed in position by CT imaging. After removal of the Chiba needle purulent fluid spontaneously returned confirming position. 035 wire was advanced into the abscess cavity and the needle was removed. Soft tissue dilation was performed and then a 10 French drain catheter was placed into the pelvic abscess. Pigtail catheter was formed and sutured in position. Final image was stored. Sample of purulent fluid was sent to the lab for analysis. Patient tolerated the procedure well and remained hemodynamically stable throughout. No complications were encountered and no significant blood loss encountered. FINDINGS: Initial CT image demonstrates abscess within the pelvic cavity, adjacent to distal sigmoid colon and the rectosigmoid junction. Persistent inflammatory changes of the mesenteric fat and of the sigmoid colon. Final image demonstrates placement of 10 French pigtail catheter into the abscess cavity. IMPRESSION: Status post CT-guided drain placement into pelvic abscess, with a sample sent to the lab for analysis. Signed, Dulcy Fanny. Earleen Newport, DO [facility] Radiology Electronically Signed   By: Corrie Mckusick D.O.   On: 05/05/2016 16:48    Labs:  CBC:  Recent Labs  05/04/16 1841 05/05/16 0335 05/06/16 0224    WBC 20.5* 22.4* 14.3*  HGB 13.3 11.7* 11.6*  HCT 37.8 34.9* 34.8*  PLT 349 296 286    COAGS:  Recent Labs  05/05/16 0708  INR 1.12  APTT 37*    BMP:  Recent Labs  05/04/16 1841 05/05/16 0335 05/06/16 0224  NA 137 139 140  K 3.2* 3.6 3.5  CL 105 107 109  CO2 20* 22 22  GLUCOSE 101* 117* 120*  BUN <5* <5* <5*  CALCIUM 9.2 8.3* 8.3*  CREATININE 0.87 0.80 0.73  GFRNONAA >60 >60 >60  GFRAA >60 >60 >60    LIVER FUNCTION TESTS:  Recent Labs  05/04/16 1841  BILITOT 0.9  AST 16  ALT 13*  ALKPHOS 66  PROT 7.6  ALBUMIN 3.4*    Assessment and Plan: S/p diverticular abscess drain 9/8; AF; labs pend today; fluid cx's- pend (group C strept); cont current tx/antbx; check f/u CT next 48 hrs if output remains minimal   Electronically Signed: D. Rowe Robert 05/08/2016, 10:40 AM   I spent a total of 15 minutes at the the patient's bedside AND on the patient's hospital floor or unit, greater than 50% of which was counseling/coordinating care for pelvic abscess drain

## 2016-05-08 NOTE — Progress Notes (Signed)
Patient tearful this am; feels that no one is listening to her.  She feels that the drain is "not right".  She says it is "pulling" and she feels pressure. She feels that the irrigant we are instilling is staying inside her and may cause infection. Tried to reassure patient by telling her the amount of output from the drain which was at least equal to the amount of irrigant instilled, but she says the drain is not working.  She says she is feeling pain in her back which she did not have before.  I offered pain meds but she refuses. Provided patient with supplies for bathing and she was somewhat relieved after bathing.  I changed the dressing on her drain.  I did notice some leakage at the insertion site when I flushed the drain.

## 2016-05-09 LAB — AEROBIC/ANAEROBIC CULTURE W GRAM STAIN (SURGICAL/DEEP WOUND)

## 2016-05-09 LAB — CBC
HCT: 35.7 % — ABNORMAL LOW (ref 36.0–46.0)
Hemoglobin: 11.9 g/dL — ABNORMAL LOW (ref 12.0–15.0)
MCH: 31.9 pg (ref 26.0–34.0)
MCHC: 33.3 g/dL (ref 30.0–36.0)
MCV: 95.7 fL (ref 78.0–100.0)
PLATELETS: 352 10*3/uL (ref 150–400)
RBC: 3.73 MIL/uL — ABNORMAL LOW (ref 3.87–5.11)
RDW: 13.2 % (ref 11.5–15.5)
WBC: 8.2 10*3/uL (ref 4.0–10.5)

## 2016-05-09 LAB — AEROBIC/ANAEROBIC CULTURE (SURGICAL/DEEP WOUND)

## 2016-05-09 MED ORDER — HEPARIN SODIUM (PORCINE) 5000 UNIT/ML IJ SOLN
5000.0000 [IU] | Freq: Three times a day (TID) | INTRAMUSCULAR | Status: AC
  Administered 2016-05-09: 5000 [IU] via SUBCUTANEOUS
  Filled 2016-05-09: qty 1

## 2016-05-09 NOTE — Progress Notes (Deleted)
No out from abdominal drain today

## 2016-05-09 NOTE — Progress Notes (Signed)
  Subjective: Jasmine Cobb is doing much better this a.m. the only pain is at the drain site. Jasmine Cobb says Jasmine Cobb can feel something when they irrigate the drain. Tolerating full liquids and anxious to go forward with a soft diet.  Objective: Vital signs in last 24 hours: Temp:  [98.2 F (36.8 C)-98.5 F (36.9 C)] 98.5 F (36.9 C) (09/12 0543) Pulse Rate:  [70-73] 70 (09/12 0543) Resp:  [19] 19 (09/12 0543) BP: (144-147)/(84-92) 144/86 (09/12 0543) SpO2:  [100 %] 100 % (09/12 0543) Last BM Date: 05/09/16 960 PO Urine 1850 Drain 15 Afebrile vital signs stable WBC down to 8.2 this a.m. H/H is stable  No film  Intake/Output from previous day: 09/11 0701 - 09/12 0700 In: 1265 [P.O.:960; IV Piggyback:300] Out: 1865 [Urine:1850; Drains:15] Intake/Output this shift: Total I/O In: 120 [P.O.:120] Out: -   General appearance: alert, cooperative and no distress Resp: clear to auscultation bilaterally GI: Soft, tender over the drain site no distention no other tenderness or pain. The drain bag has nothing in it looks like it had some purulent material at one time but none currently  Lab Results:   Recent Labs  05/09/16 0524  WBC 8.2  HGB 11.9*  HCT 35.7*  PLT 352    BMET No results for input(s): NA, K, CL, CO2, GLUCOSE, BUN, CREATININE, CALCIUM in the last 72 hours. PT/INR No results for input(s): LABPROT, INR in the last 72 hours.   Recent Labs Lab 05/04/16 1841  AST 16  ALT 13*  ALKPHOS 66  BILITOT 0.9  PROT 7.6  ALBUMIN 3.4*     Lipase     Component Value Date/Time   LIPASE 15 05/04/2016 1841     Studies/Results: No results found. Prior to Admission medications   Medication Sig Start Date End Date Taking? Authorizing Provider  LINZESS 145 MCG CAPS capsule Take 145 mcg by mouth daily. 04/25/16  Yes Historical Provider, MD  losartan (COZAAR) 25 MG tablet Take 25 mg by mouth daily. 01/28/16  Yes Historical Provider, MD    Medications: . ceFEPime (MAXIPIME) IV  2 g  Intravenous Q8H   And  . metronidazole  500 mg Intravenous Q8H  . enoxaparin (LOVENOX) injection  40 mg Subcutaneous Q24H     Assessment/Plan Recurrent diverticulitis with 3 cm abscess IR drain placement 05/05/16  Hx of hypertension FEN: Full liquids/No IV fluids  Soft diet today ID:  Day 6 Maxipime/Flagyl DVT:  Lovenox     Plan: We'll discuss repeat CT scan, and transitioned to oral antibiotics: probably tomorrow. Soft diet, today. Recheck labs in a.m. Convert Lovenox to heparin and hold after 10 PM dose.  LOS: 4 days    Maximum Reiland 05/09/2016 [phone]

## 2016-05-09 NOTE — Progress Notes (Signed)
Referring Physician(s): Dr Coralie Keens  Supervising Physician: Marybelle Killings  Patient Status:  Inpatient  Chief Complaint:  Diverticular abscess Drain placed 9/8  Subjective:  Up in room; increasing diet Denies pain Ambulating well  Allergies: Shellfish allergy; Ciprofloxacin; and Penicillins  Medications: Prior to Admission medications   Medication Sig Start Date End Date Taking? Authorizing Provider  LINZESS 145 MCG CAPS capsule Take 145 mcg by mouth daily. 04/25/16  Yes Historical Provider, MD  losartan (COZAAR) 25 MG tablet Take 25 mg by mouth daily. 01/28/16  Yes Historical Provider, MD     Vital Signs: BP (!) 144/86 (BP Location: Left Arm)   Pulse 70   Temp 98.5 F (36.9 C) (Oral)   Resp 19   Ht 5\' 5"  (1.651 m)   Wt 183 lb (83 kg)   LMP 04/21/2016   SpO2 100%   BMI 30.45 kg/m   Physical Exam  Constitutional: She is oriented to person, place, and time.  Abdominal: Soft. Bowel sounds are normal.  Neurological: She is alert and oriented to person, place, and time.  Skin: Skin is warm and dry.  Site is clean and dry NT no bleeding Output 5 cc yesterday Scant in bag   Psychiatric: She has a normal mood and affect. Her behavior is normal. Judgment and thought content normal.  Nursing note and vitals reviewed.   Imaging: Ct Image Guided Drainage By Percutaneous Catheter  Result Date: 05/05/2016 INDICATION: 50 year old female with a history of diverticulitis and abscess of the pelvis. EXAM: CT GUIDED DRAINAGE OF PELVIC ABSCESS MEDICATIONS: The patient is currently admitted to the hospital and receiving intravenous antibiotics. The antibiotics were administered within an appropriate time frame prior to the initiation of the procedure. ANESTHESIA/SEDATION: 4.0 mg IV Versed 200 mcg IV Fentanyl Moderate Sedation Time:  25 The patient was continuously monitored during the procedure by the interventional radiology nurse under my direct supervision.  COMPLICATIONS: None TECHNIQUE: Informed written consent was obtained from the patient after a thorough discussion of the procedural risks, benefits and alternatives. All questions were addressed. Maximal Sterile Barrier Technique was utilized including caps, mask, sterile gowns, sterile gloves, sterile drape, hand hygiene and skin antiseptic. A timeout was performed prior to the initiation of the procedure. PROCEDURE: Patient positioned supine position on CT gantry table. Scout CT of the abdomen/pelvis performed for planning purposes. Patient was then prepped and draped in the usual sterile fashion. The skin and subcutaneous tissues were generously infiltrated 1% lidocaine for local anesthesia. A small stab incision was made with 11 blade scalpel. Using CT guidance, a trocar needle was advanced over the left iliac bone into the pelvis. Approximately 60 cc of saline infused at the level of the sigmoid colon for hydro dissection/displacement of the colon. Once the needle was in a reasonable trajectory towards the pelvic abscess, modified Seldinger technique was used to advance a curved 20 cm Chiba needle into the abscess. The trocar needle was then advanced over the [facility] needle, confirmed in position by CT imaging. After removal of the Chiba needle purulent fluid spontaneously returned confirming position. 035 wire was advanced into the abscess cavity and the needle was removed. Soft tissue dilation was performed and then a 10 French drain catheter was placed into the pelvic abscess. Pigtail catheter was formed and sutured in position. Final image was stored. Sample of purulent fluid was sent to the lab for analysis. Patient tolerated the procedure well and remained hemodynamically stable throughout. No complications were encountered and no  significant blood loss encountered. FINDINGS: Initial CT image demonstrates abscess within the pelvic cavity, adjacent to distal sigmoid colon and the rectosigmoid junction.  Persistent inflammatory changes of the mesenteric fat and of the sigmoid colon. Final image demonstrates placement of 10 French pigtail catheter into the abscess cavity. IMPRESSION: Status post CT-guided drain placement into pelvic abscess, with a sample sent to the lab for analysis. Signed, Dulcy Fanny. Earleen Newport, DO [facility] Radiology Electronically Signed   By: Corrie Mckusick D.O.   On: 05/05/2016 16:48    Labs:  CBC:  Recent Labs  05/04/16 1841 05/05/16 0335 05/06/16 0224 05/09/16 0524  WBC 20.5* 22.4* 14.3* 8.2  HGB 13.3 11.7* 11.6* 11.9*  HCT 37.8 34.9* 34.8* 35.7*  PLT 349 296 286 352    COAGS:  Recent Labs  05/05/16 0708  INR 1.12  APTT 37*    BMP:  Recent Labs  05/04/16 1841 05/05/16 0335 05/06/16 0224  NA 137 139 140  K 3.2* 3.6 3.5  CL 105 107 109  CO2 20* 22 22  GLUCOSE 101* 117* 120*  BUN <5* <5* <5*  CALCIUM 9.2 8.3* 8.3*  CREATININE 0.87 0.80 0.73  GFRNONAA >60 >60 >60  GFRAA >60 >60 >60    LIVER FUNCTION TESTS:  Recent Labs  05/04/16 1841  BILITOT 0.9  AST 16  ALT 13*  ALKPHOS 66  PROT 7.6  ALBUMIN 3.4*    Assessment and Plan:  Abscess drain intact Output minimal x 2 days afebl wbc wnl For probable CT in am   Electronically Signed: Scotty Pinder A 05/09/2016, 1:06 PM   I spent a total of 15 Minutes at the the patient's bedside AND on the patient's hospital floor or unit, greater than 50% of which was counseling/coordinating care for abscess drain

## 2016-05-09 NOTE — Progress Notes (Signed)
No output from abdominal drain this pm

## 2016-05-09 NOTE — Care Management Important Message (Signed)
Important Message  Patient Details  Name: Jasmine Cobb MRN: [id number] Date of Birth: [birth year]   Medicare Important Message Given:  Yes    Bailen Geffre 05/09/2016, 1:09 PM

## 2016-05-10 ENCOUNTER — Encounter (HOSPITAL_COMMUNITY): Payer: Self-pay | Admitting: Radiology

## 2016-05-10 ENCOUNTER — Inpatient Hospital Stay (HOSPITAL_COMMUNITY): Payer: Medicare Other

## 2016-05-10 LAB — CBC
HCT: 34.7 % — ABNORMAL LOW (ref 36.0–46.0)
Hemoglobin: 11.6 g/dL — ABNORMAL LOW (ref 12.0–15.0)
MCH: 32 pg (ref 26.0–34.0)
MCHC: 33.4 g/dL (ref 30.0–36.0)
MCV: 95.9 fL (ref 78.0–100.0)
PLATELETS: 344 10*3/uL (ref 150–400)
RBC: 3.62 MIL/uL — ABNORMAL LOW (ref 3.87–5.11)
RDW: 12.9 % (ref 11.5–15.5)
WBC: 9.8 10*3/uL (ref 4.0–10.5)

## 2016-05-10 LAB — BASIC METABOLIC PANEL
ANION GAP: 8 (ref 5–15)
BUN: 8 mg/dL (ref 6–20)
CALCIUM: 8.4 mg/dL — AB (ref 8.9–10.3)
CO2: 24 mmol/L (ref 22–32)
CREATININE: 0.82 mg/dL (ref 0.44–1.00)
Chloride: 109 mmol/L (ref 101–111)
GLUCOSE: 112 mg/dL — AB (ref 65–99)
Potassium: 3.9 mmol/L (ref 3.5–5.1)
Sodium: 141 mmol/L (ref 135–145)

## 2016-05-10 MED ORDER — IOPAMIDOL (ISOVUE-300) INJECTION 61%
INTRAVENOUS | Status: AC
Start: 2016-05-10 — End: 2016-05-10
  Administered 2016-05-10: 100 mL
  Filled 2016-05-10: qty 100

## 2016-05-10 MED ORDER — SACCHAROMYCES BOULARDII 250 MG PO CAPS
250.0000 mg | ORAL_CAPSULE | Freq: Two times a day (BID) | ORAL | Status: DC
  Administered 2016-05-10 – 2016-05-11 (×2): 250 mg via ORAL
  Filled 2016-05-10 (×2): qty 1

## 2016-05-10 MED ORDER — SULFAMETHOXAZOLE-TRIMETHOPRIM 800-160 MG PO TABS
1.0000 | ORAL_TABLET | Freq: Two times a day (BID) | ORAL | Status: DC
  Administered 2016-05-10 – 2016-05-11 (×2): 1 via ORAL
  Filled 2016-05-10 (×2): qty 1

## 2016-05-10 NOTE — Progress Notes (Signed)
Patient ID: Jasmine Cobb, female   DOB: Jan 03, 1966, 50 y.o.   MRN: [id number] [facility] Surgery Progress Note:   * No surgery found *  Subjective: Mental status is clear.  No complaints.  Eager to have CT scan.  Waiting on contrast Objective: Vital signs in last 24 hours: Temp:  [98 F (36.7 C)-98.2 F (36.8 C)] 98 F (36.7 C) (09/13 0407) Pulse Rate:  [71-80] 78 (09/13 0407) Resp:  [16-18] 16 (09/13 0407) BP: (127-137)/(81-87) 135/87 (09/13 0407) SpO2:  [98 %-100 %] 98 % (09/13 0407)  Intake/Output from previous day: 09/12 0701 - 09/13 0700 In: 845 [P.O.:840] Out: 2750 [Urine:2750] Intake/Output this shift: Total I/O In: 360 [P.O.:360] Out: 450 [Urine:450]  Physical Exam: Work of breathing is normal.  Pain has pretty much resolved.  Awaiting CT to see if drain can be removed  Lab Results:  Results for orders placed or performed during the hospital encounter of 05/04/16 (from the past 48 hour(s))  CBC     Status: Abnormal   Collection Time: 05/09/16  5:24 AM  Result Value Ref Range   WBC 8.2 4.0 - 10.5 K/uL   RBC 3.73 (L) 3.87 - 5.11 MIL/uL   Hemoglobin 11.9 (L) 12.0 - 15.0 g/dL   HCT 35.7 (L) 36.0 - 46.0 %   MCV 95.7 78.0 - 100.0 fL   MCH 31.9 26.0 - 34.0 pg   MCHC 33.3 30.0 - 36.0 g/dL   RDW 13.2 11.5 - 15.5 %   Platelets 352 150 - 400 K/uL  CBC     Status: Abnormal   Collection Time: 05/10/16  3:47 AM  Result Value Ref Range   WBC 9.8 4.0 - 10.5 K/uL   RBC 3.62 (L) 3.87 - 5.11 MIL/uL   Hemoglobin 11.6 (L) 12.0 - 15.0 g/dL   HCT 34.7 (L) 36.0 - 46.0 %   MCV 95.9 78.0 - 100.0 fL   MCH 32.0 26.0 - 34.0 pg   MCHC 33.4 30.0 - 36.0 g/dL   RDW 12.9 11.5 - 15.5 %   Platelets 344 150 - 400 K/uL  Basic metabolic panel     Status: Abnormal   Collection Time: 05/10/16  3:47 AM  Result Value Ref Range   Sodium 141 135 - 145 mmol/L   Potassium 3.9 3.5 - 5.1 mmol/L   Chloride 109 101 - 111 mmol/L   CO2 24 22 - 32 mmol/L   Glucose, Bld 112 (H) 65 - 99 mg/dL   BUN  8 6 - 20 mg/dL   Creatinine, Ser 0.82 0.44 - 1.00 mg/dL   Calcium 8.4 (L) 8.9 - 10.3 mg/dL   GFR calc non Af Amer >60 >60 mL/min   GFR calc Af Amer >60 >60 mL/min    Comment: (NOTE) The eGFR has been calculated using the CKD EPI equation. This calculation has not been validated in all clinical situations. eGFR's persistently <60 mL/min signify possible Chronic Kidney Disease.    Anion gap 8 5 - 15    Radiology/Results: No results found.  Anti-infectives: Anti-infectives    Start     Dose/Rate Route Frequency Ordered Stop   05/05/16 1200  ceFEPIme (MAXIPIME) 2 g in dextrose 5 % 50 mL IVPB     2 g 100 mL/hr over 30 Minutes Intravenous Every 8 hours 05/05/16 0337     05/05/16 1200  metroNIDAZOLE (FLAGYL) IVPB 500 mg     500 mg 100 mL/hr over 60 Minutes Intravenous Every 8 hours 05/05/16 0337  05/05/16 0230  ceFEPIme (MAXIPIME) 2 g in dextrose 5 % 50 mL IVPB     2 g 100 mL/hr over 30 Minutes Intravenous  Once 05/05/16 0220 05/05/16 0300   05/05/16 0230  metroNIDAZOLE (FLAGYL) IVPB 500 mg     500 mg 100 mL/hr over 60 Minutes Intravenous  Once 05/05/16 0220 05/05/16 0404      Assessment/Plan: Problem List: Patient Active Problem List   Diagnosis Date Noted  . Diverticulitis of large intestine with abscess without bleeding 05/05/2016    Fate of drain per IR * No surgery found *    LOS: 5 days   Matt B. Hassell Done, MD, [facility] [phone] beeper [phone]  05/10/2016 10:50 AM

## 2016-05-10 NOTE — Discharge Summary (Signed)
[facility] Discharge Summary   Patient ID: Jasmine Cobb MRN: [id number] DOB/AGE: 1965-09-06 50 y.o.  Admit date: 05/04/2016 Discharge date: 05/11/2016  Admitting Diagnosis: Diverticulitis Diverticular abscess  Discharge Diagnosis Patient Active Problem List   Diagnosis Date Noted  . Diverticulitis of large intestine with abscess without bleeding 05/05/2016    Consultants Corrie Mckusick DO, interventional radiology  Imaging: Ct Abdomen Pelvis W Contrast  Result Date: 05/10/2016 CLINICAL DATA:  Follow-up pelvic drain possible residual abscess EXAM: CT ABDOMEN AND PELVIS WITH CONTRAST TECHNIQUE: Multidetector CT imaging of the abdomen and pelvis was performed using the standard protocol following bolus administration of intravenous contrast. CONTRAST:  126mL ISOVUE-300 IOPAMIDOL (ISOVUE-300) INJECTION 61% COMPARISON:  05/05/2016 FINDINGS: Lower chest: No acute abnormality. Hepatobiliary: No focal liver abnormality is seen. No gallstones, gallbladder wall thickening, or biliary dilatation. Pancreas: Unremarkable. No pancreatic ductal dilatation or surrounding inflammatory changes. Spleen: Normal in size without focal abnormality. Adrenals/Urinary Tract: Adrenal glands are unremarkable. Kidneys are normal, without renal calculi, focal lesion, or hydronephrosis. Bladder is unremarkable. Stomach/Bowel: Stomach is within normal limits. No pericecal inflammation. No small bowel obstruction. Appendix is normal. Again noted colonic diverticula in descending colon. There is a left pelvic drainage catheter just posterior to proximal sigmoid colon. No any collection is noted surrounding the catheter. The previous abscess has been resolved. Persistent thickening of proximal sigmoid colon wall consistent with segmental colitis or residual diverticulitis. There is tiny amount of air posterior wall of the sigmoid colon axial image 56. Although this may be air within colon a diverticulum a fistulous  tracts cannot be entirely excluded. Clinical correlation is necessary. No new pelvic abscess is noted. Vascular/Lymphatic: No aortic aneurysm. A left iliac lymph node just anterior to psoas muscle measures 1 cm short-axis probable reactive. Reproductive: The uterus is retroflexed. Calcified uterine fibroids are noted within uterus the largest in the fundus measures 1.4 cm. No adnexal masses noted. Dominant follicle within left ovary measures 1.7 cm. Other: No significant abdominal ascites. No inguinal adenopathy. Small amount of residual pericolonic stranding/inflammation noted in left anterior pelvis just anterolateral to sigmoid colon. Delayed renal images shows bilateral renal symmetrical excretion. Bilateral visualized proximal ureter is unremarkable. Musculoskeletal: No destructive bony lesions are noted. Minimal degenerative changes lumbar spine. IMPRESSION: 1. There is a left pelvic drainage catheter just posterior to proximal sigmoid colon. No any collection is noted surrounding the catheter. The previous abscess has been resolved. Persistent thickening of proximal sigmoid colon wall consistent with segmental colitis or residual diverticulitis. There is tiny amount of air posterior wall of the sigmoid colon axial image 56. Although this may be air within colon a diverticulum a fistulous tracts cannot be entirely excluded. Clinical correlation is necessary. No new pelvic abscess is noted. 2. Normal appendix.  No pericecal inflammation. 3. No hydronephrosis or hydroureter. 4. Mild enlarged left iliac lymph node probable reactive. 5. Retroflexed uterus with calcified uterine fibroids. Electronically Signed   By: Lahoma Crocker M.D.   On: 05/10/2016 16:44   Ir Cm Inj Any Colonic Tube W/fluoro  Result Date: 05/11/2016 CLINICAL DATA:  History of perforated diverticulitis, post CT-guided percutaneous drainage catheter placement. CT scan performed 05/10/2016 and demonstrated complete resolution of the left lower  quadrant/pelvic fluid collection. Additionally, the patient reports no output from the percutaneous drainage catheter. As such, the patient presents for fluoroscopic guided percutaneous drainage catheter injection. EXAM: GI TUBE INJECTION COMPARISON:  CT scan the abdomen pelvis - 05/10/2016; 05/05/2016; CT-guided left lower quadrant/pelvic percutaneous drainage catheter placement -  05/05/2016 CONTRAST:  15 cc Isovue 300, administered via the left lower quadrant percutaneous drainage catheter. FLUOROSCOPY TIME:  1 minute, 24 seconds (24 mGy) TECHNIQUE: The patient was positioned supine on the fluoroscopy table. A preprocedural spot fluoroscopic image was obtained of the left lower quadrant and existing percutaneous drainage catheter Multiple spot fluoroscopic and radiographic images were obtained following the injection of a small amount of contrast via the existing percutaneous drainage catheter. Findings were discussed with the surgical team and the decision was made to remove the percutaneous drainage catheter. As such, the external portion of the percutaneous drainage catheter was cut and a drainage catheter was removed intact. A dressing was placed. The patient tolerated the procedure well without immediate postprocedural complication. FINDINGS: Preprocedural spot fluoroscopic image demonstrates unchanged positioning of left lower abdominal quadrant / pelvic percutaneous drainage catheter. Contrast injection demonstrates opacification of a decompressed residual abscess cavity. Contrast was noted to reflux along the catheter tract to the entrance site at the skin surface. There is no definitive opacification of the adjacent colon. Administered contrast was able to be evacuated with aspiration of the percutaneous drainage catheter. IMPRESSION: Fluoroscopic guided injection demonstrates opacification of a decompressed abscess cavity within the left lower abdominal quadrant / pelvic without evidence of fistulous  connection to the adjacent colon. Above findings were discussed with the [facility], CCS PA, and the decision was made to remove the percutaneous drainage catheter. This was done at the patient's bedside without incident. Electronically Signed   By: Sandi Mariscal M.D.   On: 05/11/2016 11:03    Procedures None  Hospital Course:  50 yo female with 10 days of lower abdominal pain. Beginning last Saturday the pain became more and more severe. She also has radiation to her back. She went to the [facility] ER yesterday but left prior to full evaluation because of poor pain control. She has been having diarrhea described as yellow pudding. She has been vomiting multiple times a day and has poor PO intake for the past week. She has had multiple small bouts of diverticulitis over the last 6 years with 1 other hospitalization. She underwent colonoscopy 6 years ago with only positive finding being diverticulosis.  She has recently lost 40 pounds intentionally. She was admitted, started on IV maxipime and flagyl, and sent to IR for drain placement in focal abscess measuring 3.3x2.6x2.9cm on 05/05/16.  She tolerated the procedure well.  On 05/10/16 a repeat CT scan showed that the previous abscess had resolved. On 05/11/16 fluoroscopic drain injection was negative for the presence of fistulous connection to the adjacent sigmoid colon and therefore the decision was made to remove the drain. She completed 6 days of IV  Maxipime and Flagyl, and 2 days of PO bactrim. On 05/11/16 the patient was voiding well, tolerating diet, ambulating well, pain well controlled, vital signs stable and felt stable for discharge home.  Jasmine Cobb was discharged on bactrim, and will follow-up in our clinic in 2-3 weeks. She knows to call to make the appointment.   Physical Exam: Gen: Alert, NAD, pleasant Pulm: CTAB Abd: Soft, ND, +BS, no tenderness. Drain in place with little to not output    Medication List    TAKE these medications    LINZESS 145 MCG Caps capsule Generic drug:  linaclotide Take 145 mcg by mouth daily.   losartan 25 MG tablet Commonly known as:  COZAAR Take 25 mg by mouth daily.   saccharomyces boulardii 250 MG capsule Commonly known as:  FLORASTOR Take  1 capsule (250 mg total) by mouth 2 (two) times daily. You can get this medication over the counter   sulfamethoxazole-trimethoprim 800-160 MG tablet Commonly known as:  BACTRIM DS,SEPTRA DS Take 1 tablet by mouth 2 (two) times daily.        Follow-up Information    Arta Bruce Kinsinger, MD. Call in 2 week(s).   Specialty:  General Surgery Why:  Please call to make an appointment with Dr. Kieth Brightly as soon as you leave the hospital. Please make an appointment for 2-3 weeks. Contact information: [place] [address] [phone]           Signed: Jerrye Beavers, [facility] 05/11/2016, 3:26 PM Pager: [phone] Consults: [phone] Mon-Fri 7:00 am-4:30 pm Sat-Sun 7:00 am-11:30 am

## 2016-05-10 NOTE — Progress Notes (Signed)
Patient ID: Jasmine Cobb, female   DOB: Nov 14, 1965, 50 y.o.   MRN: [id number]    Referring Physician(s): Dr. Coralie Keens  Supervising Physician: Aletta Edouard  Patient Status: inpt  Chief Complaint: Diverticular abscess  Subjective: Patient feels great.  Essentially no drain output.   Allergies: Shellfish allergy; Ciprofloxacin; and Penicillins  Medications: Prior to Admission medications   Medication Sig Start Date End Date Taking? Authorizing Provider  LINZESS 145 MCG CAPS capsule Take 145 mcg by mouth daily. 04/25/16  Yes Historical Provider, MD  losartan (COZAAR) 25 MG tablet Take 25 mg by mouth daily. 01/28/16  Yes Historical Provider, MD    Vital Signs: BP 135/87 (BP Location: Right Arm)   Pulse 78   Temp 98 F (36.7 C) (Oral)   Resp 16   Ht 5\' 5"  (1.651 m)   Wt 183 lb (83 kg)   LMP 04/21/2016   SpO2 98%   BMI 30.45 kg/m   Physical Exam: Abd: soft, NT, ND, drain with no output currently, site is c/d/i  Imaging: No results found.  Labs:  CBC:  Recent Labs  05/05/16 0335 05/06/16 0224 05/09/16 0524 05/10/16 0347  WBC 22.4* 14.3* 8.2 9.8  HGB 11.7* 11.6* 11.9* 11.6*  HCT 34.9* 34.8* 35.7* 34.7*  PLT 296 286 352 344    COAGS:  Recent Labs  05/05/16 0708  INR 1.12  APTT 37*    BMP:  Recent Labs  05/04/16 1841 05/05/16 0335 05/06/16 0224 05/10/16 0347  NA 137 139 140 141  K 3.2* 3.6 3.5 3.9  CL 105 107 109 109  CO2 20* 22 22 24   GLUCOSE 101* 117* 120* 112*  BUN <5* <5* <5* 8  CALCIUM 9.2 8.3* 8.3* 8.4*  CREATININE 0.87 0.80 0.73 0.82  GFRNONAA >60 >60 >60 >60  GFRAA >60 >60 >60 >60    LIVER FUNCTION TESTS:  Recent Labs  05/04/16 1841  BILITOT 0.9  AST 16  ALT 13*  ALKPHOS 66  PROT 7.6  ALBUMIN 3.4*    Assessment and Plan: 1. Diverticular abscess, s/p perc drain -repeat CT scan today.  If resolution of abscess, hopefully we can pull the drain.  Will await CT scan results and d/w Dr.  Kathlene Cote.  Electronically Signed: Henreitta Cea 05/10/2016, 9:25 AM   I spent a total of 15 Minutes at the the patient's bedside AND on the patient's hospital floor or unit, greater than 50% of which was counseling/coordinating care for diverticular abscess

## 2016-05-11 ENCOUNTER — Encounter (HOSPITAL_COMMUNITY): Payer: Self-pay | Admitting: Interventional Radiology

## 2016-05-11 ENCOUNTER — Inpatient Hospital Stay (HOSPITAL_COMMUNITY): Payer: Medicare Other

## 2016-05-11 HISTORY — PX: IR GENERIC HISTORICAL: IMG1180011

## 2016-05-11 LAB — CBC
HEMATOCRIT: 37.9 % (ref 36.0–46.0)
HEMOGLOBIN: 12.6 g/dL (ref 12.0–15.0)
MCH: 31.8 pg (ref 26.0–34.0)
MCHC: 33.2 g/dL (ref 30.0–36.0)
MCV: 95.7 fL (ref 78.0–100.0)
PLATELETS: 387 10*3/uL (ref 150–400)
RBC: 3.96 MIL/uL (ref 3.87–5.11)
RDW: 13.2 % (ref 11.5–15.5)
WBC: 9.9 10*3/uL (ref 4.0–10.5)

## 2016-05-11 MED ORDER — SACCHAROMYCES BOULARDII 250 MG PO CAPS: 250.0000 mg | ORAL_CAPSULE | Freq: Two times a day (BID) | ORAL | 0 refills | Status: DC

## 2016-05-11 MED ORDER — IOPAMIDOL (ISOVUE-300) INJECTION 61%
INTRAVENOUS | Status: AC
  Administered 2016-05-11: 10 mL
  Filled 2016-05-11: qty 50

## 2016-05-11 MED ORDER — SULFAMETHOXAZOLE-TRIMETHOPRIM 800-160 MG PO TABS: 1.0000 | ORAL_TABLET | Freq: Two times a day (BID) | ORAL | 0 refills | Status: DC

## 2016-05-11 NOTE — Procedures (Signed)
Fluoroscopic drain injection is negative for the presence of a fistulous connection to the adjacent sigmoid colon. Discussed with Sabra Heck, CCS PA, and the decision was made to remove the percutaneous drain. Drainage catheter removed at the pt's bedside without incident.  No immediate post procedural complications. EBL: None  Jasmine Bacon, MD Pager #: [phone]

## 2016-05-11 NOTE — Care Management Important Message (Signed)
Important Message  Patient Details  Name: Jasmine Cobb MRN: [id number] Date of Birth: 1966/01/06   Medicare Important Message Given:  Yes    Aymara Sassi Abena 05/11/2016, 11:23 AM

## 2016-05-11 NOTE — Progress Notes (Signed)
Patient ID: Jasmine Cobb, female   DOB: 25-Sep-1965, 50 y.o.   MRN: [id number]  [facility] Surgery Progress Note     Subjective: Feeling well. Ready to go home. IR to see this morning and possibly pull drain.  Objective: Vital signs in last 24 hours: Temp:  [98 F (36.7 C)-99.1 F (37.3 C)] 99.1 F (37.3 C) (09/14 0617) Pulse Rate:  [72-92] 74 (09/14 0617) Resp:  [18-20] 19 (09/14 0617) BP: (120-148)/(74-85) 138/77 (09/14 0617) SpO2:  [98 %-100 %] 100 % (09/14 0617) Last BM Date: 05/10/16  Intake/Output from previous day: 09/13 0701 - 09/14 0700 In: 1900 [P.O.:1300; IV Piggyback:600] Out: 1300 [Urine:1300] Intake/Output this shift: No intake/output data recorded.  PE: Gen:  Alert, NAD, pleasant Pulm:  CTAB Abd: Soft, ND, +BS, no tenderness. Drain in place with little to not output    Lab Results:   Recent Labs  05/10/16 0347 05/11/16 0523  WBC 9.8 9.9  HGB 11.6* 12.6  HCT 34.7* 37.9  PLT 344 387   BMET  Recent Labs  05/10/16 0347  NA 141  K 3.9  CL 109  CO2 24  GLUCOSE 112*  BUN 8  CREATININE 0.82  CALCIUM 8.4*   PT/INR No results for input(s): LABPROT, INR in the last 72 hours. CMP     Component Value Date/Time   NA 141 05/10/2016 0347   K 3.9 05/10/2016 0347   CL 109 05/10/2016 0347   CO2 24 05/10/2016 0347   GLUCOSE 112 (H) 05/10/2016 0347   BUN 8 05/10/2016 0347   CREATININE 0.82 05/10/2016 0347   CALCIUM 8.4 (L) 05/10/2016 0347   PROT 7.6 05/04/2016 1841   ALBUMIN 3.4 (L) 05/04/2016 1841   AST 16 05/04/2016 1841   ALT 13 (L) 05/04/2016 1841   ALKPHOS 66 05/04/2016 1841   BILITOT 0.9 05/04/2016 1841   GFRNONAA >60 05/10/2016 0347   GFRAA >60 05/10/2016 0347   Lipase     Component Value Date/Time   LIPASE 15 05/04/2016 1841       Studies/Results: Ct Abdomen Pelvis W Contrast  Result Date: 05/10/2016 CLINICAL DATA:  Follow-up pelvic drain possible residual abscess EXAM: CT ABDOMEN AND PELVIS WITH CONTRAST TECHNIQUE:  Multidetector CT imaging of the abdomen and pelvis was performed using the standard protocol following bolus administration of intravenous contrast. CONTRAST:  157mL ISOVUE-300 IOPAMIDOL (ISOVUE-300) INJECTION 61% COMPARISON:  05/05/2016 FINDINGS: Lower chest: No acute abnormality. Hepatobiliary: No focal liver abnormality is seen. No gallstones, gallbladder wall thickening, or biliary dilatation. Pancreas: Unremarkable. No pancreatic ductal dilatation or surrounding inflammatory changes. Spleen: Normal in size without focal abnormality. Adrenals/Urinary Tract: Adrenal glands are unremarkable. Kidneys are normal, without renal calculi, focal lesion, or hydronephrosis. Bladder is unremarkable. Stomach/Bowel: Stomach is within normal limits. No pericecal inflammation. No small bowel obstruction. Appendix is normal. Again noted colonic diverticula in descending colon. There is a left pelvic drainage catheter just posterior to proximal sigmoid colon. No any collection is noted surrounding the catheter. The previous abscess has been resolved. Persistent thickening of proximal sigmoid colon wall consistent with segmental colitis or residual diverticulitis. There is tiny amount of air posterior wall of the sigmoid colon axial image 56. Although this may be air within colon a diverticulum a fistulous tracts cannot be entirely excluded. Clinical correlation is necessary. No new pelvic abscess is noted. Vascular/Lymphatic: No aortic aneurysm. A left iliac lymph node just anterior to psoas muscle measures 1 cm short-axis probable reactive. Reproductive: The uterus is retroflexed. Calcified uterine fibroids  are noted within uterus the largest in the fundus measures 1.4 cm. No adnexal masses noted. Dominant follicle within left ovary measures 1.7 cm. Other: No significant abdominal ascites. No inguinal adenopathy. Small amount of residual pericolonic stranding/inflammation noted in left anterior pelvis just anterolateral to  sigmoid colon. Delayed renal images shows bilateral renal symmetrical excretion. Bilateral visualized proximal ureter is unremarkable. Musculoskeletal: No destructive bony lesions are noted. Minimal degenerative changes lumbar spine. IMPRESSION: 1. There is a left pelvic drainage catheter just posterior to proximal sigmoid colon. No any collection is noted surrounding the catheter. The previous abscess has been resolved. Persistent thickening of proximal sigmoid colon wall consistent with segmental colitis or residual diverticulitis. There is tiny amount of air posterior wall of the sigmoid colon axial image 56. Although this may be air within colon a diverticulum a fistulous tracts cannot be entirely excluded. Clinical correlation is necessary. No new pelvic abscess is noted. 2. Normal appendix.  No pericecal inflammation. 3. No hydronephrosis or hydroureter. 4. Mild enlarged left iliac lymph node probable reactive. 5. Retroflexed uterus with calcified uterine fibroids. Electronically Signed   By: Lahoma Crocker M.D.   On: 05/10/2016 16:44    Anti-infectives: Anti-infectives    Start     Dose/Rate Route Frequency Ordered Stop   05/10/16 2200  sulfamethoxazole-trimethoprim (BACTRIM DS,SEPTRA DS) 800-160 MG per tablet 1 tablet     1 tablet Oral Every 12 hours 05/10/16 1700     05/05/16 1200  ceFEPIme (MAXIPIME) 2 g in dextrose 5 % 50 mL IVPB  Status:  Discontinued     2 g 100 mL/hr over 30 Minutes Intravenous Every 8 hours 05/05/16 0337 05/10/16 1700   05/05/16 1200  metroNIDAZOLE (FLAGYL) IVPB 500 mg  Status:  Discontinued     500 mg 100 mL/hr over 60 Minutes Intravenous Every 8 hours 05/05/16 0337 05/10/16 1700   05/05/16 0230  ceFEPIme (MAXIPIME) 2 g in dextrose 5 % 50 mL IVPB     2 g 100 mL/hr over 30 Minutes Intravenous  Once 05/05/16 0220 05/05/16 0300   05/05/16 0230  metroNIDAZOLE (FLAGYL) IVPB 500 mg     500 mg 100 mL/hr over 60 Minutes Intravenous  Once 05/05/16 0220 05/05/16 0404        Assessment/Plan Recurrent diverticulitis now with 3cm abscess - s/p perc drain 05/05/16 - CT scan 05/10/16 reported previous abscess resolved - antibiotics changed to Bactrim yesterday - WBC normal, afebrile - IR will likely pull drain today  ID - maxipime and flagyl stopped 05/10/16, bactrim day 2 VTE - SCD's FEN - soft diet  Plan - drain injection/removal by IR, then likely ready for discharge. Will d/c on bactrim and probiotic, follow-up with Dr. Kieth Brightly outpatient to discuss possible resection of her sigmoid colon    LOS: 6 days    Jerrye Beavers , [facility] 05/11/2016, 7:42 AM Pager: [phone] Consults: [phone] Mon-Fri 7:00 am-4:30 pm Sat-Sun 7:00 am-11:30 am

## 2016-05-11 NOTE — Discharge Instructions (Signed)
Diverticulitis °Diverticulitis is inflammation or infection of small pouches in your colon that form when you have a condition called diverticulosis. The pouches in your colon are called diverticula. Your colon, or large intestine, is where water is absorbed and stool is formed. °Complications of diverticulitis can include: °· Bleeding. °· Severe infection. °· Severe pain. °· Perforation of your colon. °· Obstruction of your colon. °CAUSES  °Diverticulitis is caused by bacteria. °Diverticulitis happens when stool becomes trapped in diverticula. This allows bacteria to grow in the diverticula, which can lead to inflammation and infection. °RISK FACTORS °People with diverticulosis are at risk for diverticulitis. Eating a diet that does not include enough fiber from fruits and vegetables may make diverticulitis more likely to develop. °SYMPTOMS  °Symptoms of diverticulitis may include: °· Abdominal pain and tenderness. The pain is normally located on the left side of the abdomen, but may occur in other areas. °· Fever and chills. °· Bloating. °· Cramping. °· Nausea. °· Vomiting. °· Constipation. °· Diarrhea. °· Blood in your stool. °DIAGNOSIS  °Your health care provider will ask you about your medical history and do a physical exam. You may need to have tests done because many medical conditions can cause the same symptoms as diverticulitis. Tests may include: °· Blood tests. °· Urine tests. °· Imaging tests of the abdomen, including X-rays and CT scans. °When your condition is under control, your health care provider may recommend that you have a colonoscopy. A colonoscopy can show how severe your diverticula are and whether something else is causing your symptoms. °TREATMENT  °Most cases of diverticulitis are mild and can be treated at home. Treatment may include: °· Taking over-the-counter pain medicines. °· Following a clear liquid diet. °· Taking antibiotic medicines by mouth for 7-10 days. °More severe cases may  be treated at a hospital. Treatment may include: °· Not eating or drinking. °· Taking prescription pain medicine. °· Receiving antibiotic medicines through an IV tube. °· Receiving fluids and nutrition through an IV tube. °· Surgery. °HOME CARE INSTRUCTIONS  °· Follow your health care provider's instructions carefully. °· Follow a full liquid diet or other diet as directed by your health care provider. After your symptoms improve, your health care provider may tell you to change your diet. He or she may recommend you eat a high-fiber diet. Fruits and vegetables are good sources of fiber. Fiber makes it easier to pass stool. °· Take fiber supplements or probiotics as directed by your health care provider. °· Only take medicines as directed by your health care provider. °· Keep all your follow-up appointments. °SEEK MEDICAL CARE IF:  °· Your pain does not improve. °· You have a hard time eating food. °· Your bowel movements do not return to normal. °SEEK IMMEDIATE MEDICAL CARE IF:  °· Your pain becomes worse. °· Your symptoms do not get better. °· Your symptoms suddenly get worse. °· You have a fever. °· You have repeated vomiting. °· You have bloody or black, tarry stools. °MAKE SURE YOU:  °· Understand these instructions. °· Will watch your condition. °· Will get help right away if you are not doing well or get worse. °  °This information is not intended to replace advice given to you by your health care provider. Make sure you discuss any questions you have with your health care provider. °  °Document Released: 05/24/2005 Document Revised: 08/19/2013 Document Reviewed: 07/09/2013 °[employer]vier Interactive Patient Education ©2016 [employer] ° °

## 2016-05-24 DIAGNOSIS — Z880 Allergy status to penicillin: Secondary | ICD-10-CM | POA: Diagnosis not present

## 2016-05-24 DIAGNOSIS — Z881 Allergy status to other antibiotic agents status: Secondary | ICD-10-CM | POA: Diagnosis not present

## 2016-05-24 DIAGNOSIS — I10 Essential (primary) hypertension: Secondary | ICD-10-CM | POA: Diagnosis not present

## 2016-05-24 DIAGNOSIS — Z0000 Encounter for general adult medical examination without abnormal findings: Secondary | ICD-10-CM | POA: Diagnosis not present

## 2016-05-24 DIAGNOSIS — Z79899 Other long term (current) drug therapy: Secondary | ICD-10-CM | POA: Diagnosis not present

## 2016-05-24 DIAGNOSIS — K5720 Diverticulitis of large intestine with perforation and abscess without bleeding: Secondary | ICD-10-CM | POA: Diagnosis not present

## 2016-05-24 DIAGNOSIS — Z888 Allergy status to other drugs, medicaments and biological substances status: Secondary | ICD-10-CM | POA: Diagnosis not present

## 2016-05-26 DIAGNOSIS — K5909 Other constipation: Secondary | ICD-10-CM | POA: Diagnosis not present

## 2016-05-26 DIAGNOSIS — K5720 Diverticulitis of large intestine with perforation and abscess without bleeding: Secondary | ICD-10-CM | POA: Diagnosis not present

## 2016-06-08 ENCOUNTER — Encounter: Payer: Self-pay | Admitting: Gastroenterology

## 2016-06-14 ENCOUNTER — Ambulatory Visit: Payer: Medicare Other | Admitting: Gastroenterology

## 2017-01-06 ENCOUNTER — Encounter (HOSPITAL_COMMUNITY): Payer: Self-pay | Admitting: Emergency Medicine

## 2017-01-06 ENCOUNTER — Emergency Department (HOSPITAL_COMMUNITY): Payer: Medicare Other

## 2017-01-06 ENCOUNTER — Inpatient Hospital Stay (HOSPITAL_COMMUNITY)
Admission: EM | Admit: 2017-01-06 | Discharge: 2017-01-09 | DRG: 392 | Disposition: A | Discharge status: Home / Self Care | Payer: Medicare Other | Attending: Nephrology | Admitting: Nephrology

## 2017-01-06 DIAGNOSIS — D259 Leiomyoma of uterus, unspecified: Secondary | ICD-10-CM | POA: Diagnosis not present

## 2017-01-06 DIAGNOSIS — K5720 Diverticulitis of large intestine with perforation and abscess without bleeding: Principal | ICD-10-CM | POA: Diagnosis present

## 2017-01-06 DIAGNOSIS — Z880 Allergy status to penicillin: Secondary | ICD-10-CM | POA: Diagnosis not present

## 2017-01-06 DIAGNOSIS — Z881 Allergy status to other antibiotic agents status: Secondary | ICD-10-CM

## 2017-01-06 DIAGNOSIS — K5730 Diverticulosis of large intestine without perforation or abscess without bleeding: Secondary | ICD-10-CM | POA: Diagnosis not present

## 2017-01-06 DIAGNOSIS — Z79899 Other long term (current) drug therapy: Secondary | ICD-10-CM | POA: Diagnosis not present

## 2017-01-06 DIAGNOSIS — Z87891 Personal history of nicotine dependence: Secondary | ICD-10-CM | POA: Diagnosis not present

## 2017-01-06 DIAGNOSIS — Z91013 Allergy to seafood: Secondary | ICD-10-CM

## 2017-01-06 DIAGNOSIS — R102 Pelvic and perineal pain: Secondary | ICD-10-CM

## 2017-01-06 DIAGNOSIS — I10 Essential (primary) hypertension: Secondary | ICD-10-CM | POA: Diagnosis not present

## 2017-01-06 DIAGNOSIS — Z792 Long term (current) use of antibiotics: Secondary | ICD-10-CM

## 2017-01-06 LAB — CBC
HEMATOCRIT: 38.5 % (ref 36.0–46.0)
HEMOGLOBIN: 13.5 g/dL (ref 12.0–15.0)
MCH: 33.7 pg (ref 26.0–34.0)
MCHC: 35.1 g/dL (ref 30.0–36.0)
MCV: 96 fL (ref 78.0–100.0)
Platelets: 286 10*3/uL (ref 150–400)
RBC: 4.01 MIL/uL (ref 3.87–5.11)
RDW: 12.3 % (ref 11.5–15.5)
WBC: 17.4 10*3/uL — AB (ref 4.0–10.5)

## 2017-01-06 LAB — URINALYSIS, ROUTINE W REFLEX MICROSCOPIC
Bilirubin Urine: NEGATIVE
GLUCOSE, UA: NEGATIVE mg/dL
Hgb urine dipstick: NEGATIVE
Ketones, ur: 20 mg/dL — AB
LEUKOCYTES UA: NEGATIVE
Nitrite: NEGATIVE
PH: 6 (ref 5.0–8.0)
PROTEIN: NEGATIVE mg/dL
SPECIFIC GRAVITY, URINE: 1.01 (ref 1.005–1.030)

## 2017-01-06 LAB — I-STAT BETA HCG BLOOD, ED (MC, WL, AP ONLY): I-stat hCG, quantitative: 13.3 m[IU]/mL — ABNORMAL HIGH (ref <5)

## 2017-01-06 LAB — COMPREHENSIVE METABOLIC PANEL
ALBUMIN: 3.7 g/dL (ref 3.5–5.0)
ALK PHOS: 71 U/L (ref 38–126)
ALT: 12 U/L — ABNORMAL LOW (ref 14–54)
ANION GAP: 12 (ref 5–15)
AST: 16 U/L (ref 15–41)
BILIRUBIN TOTAL: 0.7 mg/dL (ref 0.3–1.2)
BUN: 5 mg/dL — ABNORMAL LOW (ref 6–20)
CALCIUM: 9.1 mg/dL (ref 8.9–10.3)
CO2: 19 mmol/L — ABNORMAL LOW (ref 22–32)
Chloride: 106 mmol/L (ref 101–111)
Creatinine, Ser: 0.88 mg/dL (ref 0.44–1.00)
GLUCOSE: 109 mg/dL — AB (ref 65–99)
Potassium: 3.7 mmol/L (ref 3.5–5.1)
Sodium: 137 mmol/L (ref 135–145)
TOTAL PROTEIN: 8 g/dL (ref 6.5–8.1)

## 2017-01-06 LAB — LIPASE, BLOOD: Lipase: 15 U/L (ref 11–51)

## 2017-01-06 MED ORDER — METRONIDAZOLE IN NACL 5-0.79 MG/ML-% IV SOLN
500.0000 mg | Freq: Three times a day (TID) | INTRAVENOUS | Status: DC
  Administered 2017-01-07 – 2017-01-09 (×7): 500 mg via INTRAVENOUS
  Filled 2017-01-06 (×9): qty 100

## 2017-01-06 MED ORDER — ENOXAPARIN SODIUM 40 MG/0.4ML ~~LOC~~ SOLN: 40.0000 mg | SUBCUTANEOUS | Status: DC

## 2017-01-06 MED ORDER — SIMETHICONE 80 MG PO CHEW
80.0000 mg | CHEWABLE_TABLET | Freq: Four times a day (QID) | ORAL | Status: DC | PRN
  Administered 2017-01-07: 80 mg via ORAL
  Filled 2017-01-06: qty 1

## 2017-01-06 MED ORDER — IOPAMIDOL (ISOVUE-300) INJECTION 61%
INTRAVENOUS | Status: AC
  Filled 2017-01-06: qty 100

## 2017-01-06 MED ORDER — ONDANSETRON HCL 4 MG/2ML IJ SOLN: 4.0000 mg | Freq: Four times a day (QID) | INTRAMUSCULAR | Status: DC | PRN

## 2017-01-06 MED ORDER — ACETAMINOPHEN 650 MG RE SUPP: 650.0000 mg | Freq: Four times a day (QID) | RECTAL | Status: DC | PRN

## 2017-01-06 MED ORDER — METRONIDAZOLE IN NACL 5-0.79 MG/ML-% IV SOLN
500.0000 mg | Freq: Once | INTRAVENOUS | Status: AC
  Administered 2017-01-06: 500 mg via INTRAVENOUS
  Filled 2017-01-06: qty 100

## 2017-01-06 MED ORDER — SODIUM CHLORIDE 0.9 % IV SOLN
INTRAVENOUS | Status: AC
  Administered 2017-01-07: 01:00:00 via INTRAVENOUS

## 2017-01-06 MED ORDER — ACETAMINOPHEN 325 MG PO TABS
650.0000 mg | ORAL_TABLET | Freq: Four times a day (QID) | ORAL | Status: DC | PRN
  Administered 2017-01-07 (×2): 650 mg via ORAL
  Filled 2017-01-06 (×3): qty 2

## 2017-01-06 MED ORDER — ACETAMINOPHEN 500 MG PO TABS
1000.0000 mg | ORAL_TABLET | Freq: Once | ORAL | Status: AC
  Administered 2017-01-06: 1000 mg via ORAL
  Filled 2017-01-06: qty 2

## 2017-01-06 MED ORDER — ONDANSETRON HCL 4 MG PO TABS: 4.0000 mg | ORAL_TABLET | Freq: Four times a day (QID) | ORAL | Status: DC | PRN

## 2017-01-06 MED ORDER — DEXTROSE 5 % IV SOLN
2.0000 g | Freq: Once | INTRAVENOUS | Status: AC
  Administered 2017-01-06: 2 g via INTRAVENOUS
  Filled 2017-01-06: qty 2

## 2017-01-06 MED ORDER — HYDROCODONE-ACETAMINOPHEN 5-325 MG PO TABS
1.0000 | ORAL_TABLET | ORAL | Status: DC | PRN
  Administered 2017-01-07 – 2017-01-08 (×3): 2 via ORAL
  Filled 2017-01-06 (×3): qty 2

## 2017-01-06 NOTE — ED Provider Notes (Signed)
[facility] DEPT Provider Note   CSN: [id number] Arrival date & time: 01/06/17  1533     History   Chief Complaint Chief Complaint  Patient presents with  . Abdominal Pain  . Diverticulitis    HPI Jasmine Cobb is a 51 y.o. female.  HPI Patient presents with concern of lower abdominal, left lower back pain. Patient has a notable history of diverticulitis, with last episode in September, 2017. She states that since that time she has had monthly episodes of pain exacerbation, typically left lower quadrant, left lower back. These episodes typically resolve spontaneously, but over the past month she has had persistent pain in this area. Pain is sore, severe, with inconsistent exacerbations. During this most recent.  The patient has had change in bowel movements, with more solid stool, but also with new tenesmus. No vomiting, no fever. Patient has not seen surgery as an outpatient, after her hospitalization for complication diverticulitis last year.  Past Medical History:  Diagnosis Date  . Diverticulitis   . Hypertension     Patient Active Problem List   Diagnosis Date Noted  . Diverticulitis of large intestine with abscess without bleeding 05/05/2016    Past Surgical History:  Procedure Laterality Date  . IR GENERIC HISTORICAL  05/11/2016   IR CM INJ ANY COLONIC TUBE W/FLUORO 05/11/2016 [facility]  . TUBAL LIGATION      OB History    No data available       Home Medications    Prior to Admission medications   Medication Sig Start Date End Date Taking? Authorizing Provider  LINZESS 145 MCG CAPS capsule Take 145 mcg by mouth daily. 04/25/16   [provider]  losartan (COZAAR) 25 MG tablet Take 25 mg by mouth daily. 01/28/16   [provider]  saccharomyces boulardii (FLORASTOR) 250 MG capsule Take 1 capsule (250 mg total) by mouth 2 (two) times daily. You can get this medication over the counter 05/11/16   Izora Gala A, PA-C    sulfamethoxazole-trimethoprim (BACTRIM DS,SEPTRA DS) 800-160 MG tablet Take 1 tablet by mouth 2 (two) times daily. 05/11/16   Jerrye Beavers, PA-C    Family History No family history on file.  Social History Social History  Substance Use Topics  . Smoking status: Former Smoker    Types: Cigarettes  . Smokeless tobacco: Never Used  . Alcohol use Yes     Comment: Rare     Allergies   Shellfish allergy; Ciprofloxacin; and Penicillins   Review of Systems Review of Systems  Constitutional:       Per HPI, otherwise negative  HENT:       Per HPI, otherwise negative  Respiratory:       Per HPI, otherwise negative  Cardiovascular:       Per HPI, otherwise negative  Gastrointestinal: Negative for vomiting.  Endocrine:       Negative aside from HPI  Genitourinary:       Neg aside from HPI   Musculoskeletal:       Per HPI, otherwise negative  Skin: Negative.   Neurological: Negative for syncope.     Physical Exam Updated Vital Signs BP 135/78   Pulse 85   Temp 98.5 F (36.9 C) (Oral)   Resp 18   LMP 12/15/2016   SpO2 100%   Physical Exam  Constitutional: She is oriented to person, place, and time. She appears well-developed and well-nourished. No distress.  HENT:  Head: Normocephalic and atraumatic.  Eyes: Conjunctivae  and EOM are normal.  Cardiovascular: Normal rate and regular rhythm.   Pulmonary/Chest: Effort normal and breath sounds normal. No stridor. No respiratory distress.  Abdominal: She exhibits no distension.    Musculoskeletal: She exhibits no edema.  Neurological: She is alert and oriented to person, place, and time. No cranial nerve deficit.  Skin: Skin is warm and dry.  Psychiatric: She has a normal mood and affect.  Nursing note and vitals reviewed.    ED Treatments / Results  Labs (all labs ordered are listed, but only abnormal results are displayed) Labs Reviewed  COMPREHENSIVE METABOLIC PANEL - Abnormal; Notable for the following:        Result Value   CO2 19 (*)    Glucose, Bld 109 (*)    BUN 5 (*)    ALT 12 (*)    All other components within normal limits  CBC - Abnormal; Notable for the following:    WBC 17.4 (*)    All other components within normal limits  URINALYSIS, ROUTINE W REFLEX MICROSCOPIC - Abnormal; Notable for the following:    Ketones, ur 20 (*)    All other components within normal limits  I-STAT BETA HCG BLOOD, ED ([facility], [facility], [facility] ONLY) - Abnormal; Notable for the following:    I-stat hCG, quantitative 13.3 (*)    All other components within normal limits  LIPASE, BLOOD    EKG  EKG Interpretation None       CT study 04/2016 IMPRESSION: 1. There is a left pelvic drainage catheter just posterior to proximal sigmoid colon. No any collection is noted surrounding the catheter. The previous abscess has been resolved. Persistent thickening of proximal sigmoid colon wall consistent with segmental colitis or residual diverticulitis. There is tiny amount of air posterior wall of the sigmoid colon axial image 56. Although this may be air within colon a diverticulum a fistulous tracts cannot be entirely excluded. Clinical correlation is necessary. No new pelvic abscess is noted. 2. Normal appendix.  No pericecal inflammation. 3. No hydronephrosis or hydroureter. 4. Mild enlarged left iliac lymph node probable reactive. 5. Retroflexed uterus with calcified uterine fibroids.     Electronically Signed   By: Lahoma Crocker M.D.   On: 05/10/2016 16:44   Radiology Ct Abdomen Pelvis W Contrast  Result Date: 01/06/2017 CLINICAL DATA:  Left lower quadrant/suprapubic pain. History of diverticulitis. EXAM: CT ABDOMEN AND PELVIS WITH CONTRAST TECHNIQUE: Multidetector CT imaging of the abdomen and pelvis was performed using the standard protocol following bolus administration of intravenous contrast. CONTRAST:  100 cc Isovue-300 IV. COMPARISON:  05/10/2016 CT abdomen/ pelvis. FINDINGS: Lower chest: No  significant pulmonary nodules or acute consolidative airspace disease. Hepatobiliary: Normal size liver. No liver mass. Normal gallbladder with no radiopaque cholelithiasis. No biliary ductal dilatation. Pancreas: Normal, with no mass or duct dilation. Spleen: Normal size. No mass. Adrenals/Urinary Tract: Normal adrenals. No renal mass. No hydronephrosis. Normal bladder. Stomach/Bowel: Grossly normal stomach. Normal caliber small bowel. Normal appendix. There is mild diverticulosis of the descending and sigmoid colon. There is prominent circumferential colonic wall thickening and wall hyperenhancement throughout the mid to distal sigmoid colon with associated prominent pericolonic fat stranding. There is a small 2.3 x 1.8 x 2.1 cm collection in the sigmoid mesentery with thick enhancing wall (series 3/ image 65), which appears to communicate with the thick-walled sigmoid colon via a small fistula tract (series 7/image 88). There are a few mildly thick walled upper pelvic small bowel loops adjacent to the sigmoid  mesentery abscess. Vascular/Lymphatic: Atherosclerotic nonaneurysmal abdominal aorta. Patent portal, splenic, hepatic and renal veins. No pathologically enlarged lymph nodes in the abdomen or pelvis. 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 Ring enhancing 1.4 x 1.3 cm right adnexal structure (series 3/image 70). No left adnexal mass. Other: No pneumoperitoneum, ascites or focal fluid collection. No pneumoperitoneum. No ascites. Musculoskeletal: No aggressive appearing focal osseous lesions. Mild thoracolumbar spondylosis. IMPRESSION: 1. Prominent circumferential wall thickening, wall hyperenhancement and pericolonic fat stranding throughout the mid to distal sigmoid colon with associated mild distal colonic diverticulosis. These nonspecific findings are most consistent with acute diverticulitis superimposed on chronic sigmoid diverticulosis, although an underlying  sigmoid neoplasm is difficult to exclude, and colonoscopy should be considered after treatment of this inflammatory episode if not recently performed. 2. Small sigmoid mesentery abscess, which appears to communicate with the sigmoid colon via a small fistula tract. No free air. 3. Reactive wall thickening within pelvic small bowel loops. 4. Nonspecific ring enhancing 1.4 cm right adnexal structure, which could be physiologic if the patient is perimenopausal, with a tiny tubo-ovarian abscess not excluded. Transabdominal and transvaginal pelvic ultrasound correlation could be obtained as clinically warranted. 5. Enlarged myomatous retroverted uterus. 6. Aortic atherosclerosis . Electronically Signed   By: Ilona Sorrel M.D.   On: 01/06/2017 22:20    Procedures Procedures (including critical care time)  Medications Ordered in ED Medications  iopamidol (ISOVUE-300) 61 % injection (not administered)  iopamidol (ISOVUE-300) 61 % injection (not administered)  ceFEPIme (MAXIPIME) 2 g in dextrose 5 % 50 mL IVPB (not administered)    And  metroNIDAZOLE (FLAGYL) IVPB 500 mg (not administered)  acetaminophen (TYLENOL) tablet 1,000 mg (not administered)     Initial Impression / Assessment and Plan / ED Course  I have reviewed the triage vital signs and the nursing notes.  Pertinent labs & imaging results that were available during my care of the patient were reviewed by me and considered in my medical decision making (see chart for details).  On repeat exam the patient is awake and alert, calm. We discussed all findings including evidence for diverticulitis, with no evidence for perforation, though with some concern for possible fistula or abscess. Patient is afebrile, but with leukocytosis, has started to receive antibiotics. Patient requires admission for further evaluation and management.   Final Clinical Impressions(s) / ED Diagnoses  Diverticulitis with abscess   Carmin Muskrat, MD 01/06/17  2337

## 2017-01-06 NOTE — H&P (Signed)
History and Physical    Jasmine Cobb PNT:[id number] DOB: [birth year] DOA: 01/06/2017  PCP: System, Pcp Not In   Patient coming from: Home  Chief Complaint: Lower abd pain  HPI: Jasmine Cobb is a 51 y.o. female with medical history significant for hypertension and diverticulosis with history of diverticulitis, now presenting to the emergency department with lower abdominal pain. Patient reports that she had been in her usual state of health until noting the insidious development of mild crampy lower abdominal discomfort approximately a month ago. Since that time, she has had intermittent symptoms, but these have become constant and severe over the past couple days. She denies any fevers or chills and denies melena or hematochezia. She describes the pain currently as severe in intensity, cramping in character, radiating to the low back, now constant, and without any alleviating or exacerbating factors identified. Patient denies dyspnea or cough, chest pain or palpitations, or lower extremity swelling or tenderness. Of note, patient presented under similar circumstances in September 2017, was noted to have acute diverticulitis with abscess, was treated with antibiotics and IR drainage, and was to follow-up with Gen. surgery for possible resection of the affected colon, but had just begun working at a new job and was unable to make the appointment. She did not reschedule.  ED Course: Upon arrival to the ED, patient is found to be afebrile, saturating well on room air, and with vital signs stable. EKG features a normal sinus rhythm, chemistry panels unremarkable, and CBC is notable for a leukocytosis to 17,400. CT of the abdomen and pelvis was obtained with contrast and features prominent circumferential wall thickening, enhancement, and pericolonic fat stranding throughout the mid to distal sigmoid colon consistent with diverticulitis, but with underlying neoplasia not excluded. CT features a sigmoid  mesentery abscess appears to communicate with sigmoid colon via small fistulous tract. There is no free air. Patient was given a gram of Tylenol, 1 L of normal saline, and treated with empiric cefepime and Flagyl in the ED. She has remained hemodynamically stable, has not been in any apparent respiratory distress, and will be admitted to the medical-surgical unit for ongoing evaluation and management of acute sigmoid diverticulitis with abscess.  Review of Systems:  All other systems reviewed and apart from HPI, are negative.  Past Medical History:  Diagnosis Date  . Diverticulitis   . Hypertension     Past Surgical History:  Procedure Laterality Date  . IR GENERIC HISTORICAL  05/11/2016   IR CM INJ ANY COLONIC TUBE W/FLUORO 05/11/2016 [facility]  . TUBAL LIGATION       reports that she has quit smoking. Her smoking use included Cigarettes. She has never used smokeless tobacco. She reports that she drinks alcohol. She reports that she uses drugs, including Marijuana.  Allergies  Allergen Reactions  . Shellfish Allergy Anaphylaxis  . Ciprofloxacin Swelling  . Penicillins Other (See Comments)    unknown    History reviewed. No pertinent family history.   Prior to Admission medications   Medication Sig Start Date End Date Taking? Authorizing Provider  LINZESS 145 MCG CAPS capsule Take 145 mcg by mouth daily. 04/25/16  Yes [provider]  losartan (COZAAR) 25 MG tablet Take 25 mg by mouth daily. 01/28/16  Yes [provider]  polyethylene glycol (MIRALAX / GLYCOLAX) packet Take 17 g by mouth daily as needed for mild constipation.   Yes [provider]  vitamin C (ASCORBIC ACID) 500 MG tablet Take 500 mg by mouth daily.  Yes [provider]    Physical Exam: Vitals:   01/06/17 1542 01/06/17 1807 01/06/17 1815  BP: 105/76 135/78 128/87  Pulse: 93 85 90  Resp: 16 18 18   Temp: 98.5 F (36.9 C)    TempSrc: Oral    SpO2: 97% 100% 100%       Constitutional: NAD, calm, in apparent discomfort.  Eyes: PERTLA, lids and conjunctivae normal ENMT: Mucous membranes are moist. Posterior pharynx clear of any exudate or lesions.   Neck: normal, supple, no masses, no thyromegaly Respiratory: clear to auscultation bilaterally, no wheezing, no crackles. Normal respiratory effort.   Cardiovascular: S1 & S2 heard, regular rate and rhythm. No extremity edema. No significant JVD. Abdomen: No distension, soft, tender across lower abdomen without rebound pain or guarding, no masses palpated. Bowel sounds active.  Musculoskeletal: no clubbing / cyanosis. No joint deformity upper and lower extremities.   Skin: no significant rashes, lesions, ulcers. Warm, dry, well-perfused. Neurologic: CN 2-12 grossly intact. Sensation intact, DTR normal. Strength 5/5 in all 4 limbs.  Psychiatric: Alert and oriented x 3. Pleasant and cooperative.     Labs on Admission: I have personally reviewed following labs and imaging studies  CBC:  Recent Labs Lab 01/06/17 1555  WBC 17.4*  HGB 13.5  HCT 38.5  MCV 96.0  PLT 846   Basic Metabolic Panel:  Recent Labs Lab 01/06/17 1555  NA 137  K 3.7  CL 106  CO2 19*  GLUCOSE 109*  BUN 5*  CREATININE 0.88  CALCIUM 9.1   GFR: CrCl cannot be calculated (Unknown ideal weight.). Liver Function Tests:  Recent Labs Lab 01/06/17 1555  AST 16  ALT 12*  ALKPHOS 71  BILITOT 0.7  PROT 8.0  ALBUMIN 3.7    Recent Labs Lab 01/06/17 1555  LIPASE 15   No results for input(s): AMMONIA in the last 168 hours. Coagulation Profile: No results for input(s): INR, PROTIME in the last 168 hours. Cardiac Enzymes: No results for input(s): CKTOTAL, CKMB, CKMBINDEX, TROPONINI in the last 168 hours. BNP (last 3 results) No results for input(s): PROBNP in the last 8760 hours. HbA1C: No results for input(s): HGBA1C in the last 72 hours. CBG: No results for input(s): GLUCAP in the last 168 hours. Lipid  Profile: No results for input(s): CHOL, HDL, LDLCALC, TRIG, CHOLHDL, LDLDIRECT in the last 72 hours. Thyroid Function Tests: No results for input(s): TSH, T4TOTAL, FREET4, T3FREE, THYROIDAB in the last 72 hours. Anemia Panel: No results for input(s): VITAMINB12, FOLATE, FERRITIN, TIBC, IRON, RETICCTPCT in the last 72 hours. Urine analysis:    Component Value Date/Time   COLORURINE YELLOW 01/06/2017 [place] 01/06/2017 1619   LABSPEC 1.010 01/06/2017 1619   PHURINE 6.0 01/06/2017 1619   GLUCOSEU NEGATIVE 01/06/2017 1619   HGBUR NEGATIVE 01/06/2017 1619   BILIRUBINUR NEGATIVE 01/06/2017 1619   KETONESUR 20 (A) 01/06/2017 1619   PROTEINUR NEGATIVE 01/06/2017 1619   NITRITE NEGATIVE 01/06/2017 1619   LEUKOCYTESUR NEGATIVE 01/06/2017 1619   Sepsis Labs: @LABRCNTIP (procalcitonin:4,lacticidven:4) )No results found for this or any previous visit (from the past 240 hour(s)).   Radiological Exams on Admission: Ct Abdomen Pelvis W Contrast  Result Date: 01/06/2017 CLINICAL DATA:  Left lower quadrant/suprapubic pain. History of diverticulitis. EXAM: CT ABDOMEN AND PELVIS WITH CONTRAST TECHNIQUE: Multidetector CT imaging of the abdomen and pelvis was performed using the standard protocol following bolus administration of intravenous contrast. CONTRAST:  100 cc Isovue-300 IV. COMPARISON:  05/10/2016 CT abdomen/ pelvis. FINDINGS: Lower  chest: No significant pulmonary nodules or acute consolidative airspace disease. Hepatobiliary: Normal size liver. No liver mass. Normal gallbladder with no radiopaque cholelithiasis. No biliary ductal dilatation. Pancreas: Normal, with no mass or duct dilation. Spleen: Normal size. No mass. Adrenals/Urinary Tract: Normal adrenals. No renal mass. No hydronephrosis. Normal bladder. Stomach/Bowel: Grossly normal stomach. Normal caliber small bowel. Normal appendix. There is mild diverticulosis of the descending and sigmoid colon. There is prominent  circumferential colonic wall thickening and wall hyperenhancement throughout the mid to distal sigmoid colon with associated prominent pericolonic fat stranding. There is a small 2.3 x 1.8 x 2.1 cm collection in the sigmoid mesentery with thick enhancing wall (series 3/ image 65), which appears to communicate with the thick-walled sigmoid colon via a small fistula tract (series 7/image 88). There are a few mildly thick walled upper pelvic small bowel loops adjacent to the sigmoid mesentery abscess. Vascular/Lymphatic: Atherosclerotic nonaneurysmal abdominal aorta. Patent portal, splenic, hepatic and renal veins. No pathologically enlarged lymph nodes in the abdomen or pelvis. 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 Ring enhancing 1.4 x 1.3 cm right adnexal structure (series 3/image 70). No left adnexal mass. Other: No pneumoperitoneum, ascites or focal fluid collection. No pneumoperitoneum. No ascites. Musculoskeletal: No aggressive appearing focal osseous lesions. Mild thoracolumbar spondylosis. IMPRESSION: 1. Prominent circumferential wall thickening, wall hyperenhancement and pericolonic fat stranding throughout the mid to distal sigmoid colon with associated mild distal colonic diverticulosis. These nonspecific findings are most consistent with acute diverticulitis superimposed on chronic sigmoid diverticulosis, although an underlying sigmoid neoplasm is difficult to exclude, and colonoscopy should be considered after treatment of this inflammatory episode if not recently performed. 2. Small sigmoid mesentery abscess, which appears to communicate with the sigmoid colon via a small fistula tract. No free air. 3. Reactive wall thickening within pelvic small bowel loops. 4. Nonspecific ring enhancing 1.4 cm right adnexal structure, which could be physiologic if the patient is perimenopausal, with a tiny tubo-ovarian abscess not excluded. Transabdominal and transvaginal  pelvic ultrasound correlation could be obtained as clinically warranted. 5. Enlarged myomatous retroverted uterus. 6. Aortic atherosclerosis . Electronically Signed   By: Ilona Sorrel M.D.   On: 01/06/2017 22:20    EKG: Independently reviewed. Normal sinus rhythm.   Assessment/Plan  1. Acute diverticulitis with abscess  - Pt reports one month of intermittent crampy lower abdominal pain, becoming severe and constant over the past couple days  - Labs notable for WBC 17,400 and CT-findings consistent with sigmoid diverticulitis with abscess, underlying neoplasia not excluded   - She was given 1 L NS, APAP, and empiric cefepime and Flagyl in ED  - Plan to continue empiric abx with cefepime and Flagyl, consult IR for drainage, continue bowel rest, IVF, and analgesia    2. Hypertension  - BP at goal, a little soft in ED  - Managed with losartan at home, held initially     DVT prophylaxis: sq Lovenox  Code Status: Full  Family Communication: Discussed with patient Disposition Plan: Admit to med-surg Consults called: IR consult requested  Admission status: Inpatient    Vianne Bulls, MD [facility] Pager [phone]  If 7PM-7AM, please contact night-coverage [email or web address] Password [facility]  01/06/2017, 11:52 PM

## 2017-01-06 NOTE — ED Triage Notes (Signed)
c/o abd and flank pain with jelly like stools-- states has had diverticulitis episodes in the past and this feels the same.

## 2017-01-07 LAB — CBC WITH DIFFERENTIAL/PLATELET
BASOS ABS: 0 10*3/uL (ref 0.0–0.1)
BASOS PCT: 0 %
EOS PCT: 1 %
Eosinophils Absolute: 0.1 10*3/uL (ref 0.0–0.7)
HEMATOCRIT: 36.9 % (ref 36.0–46.0)
Hemoglobin: 12.5 g/dL (ref 12.0–15.0)
LYMPHS PCT: 15 %
Lymphs Abs: 2.4 10*3/uL (ref 0.7–4.0)
MCH: 33.2 pg (ref 26.0–34.0)
MCHC: 33.9 g/dL (ref 30.0–36.0)
MCV: 97.9 fL (ref 78.0–100.0)
MONO ABS: 0.8 10*3/uL (ref 0.1–1.0)
MONOS PCT: 5 %
NEUTROS ABS: 12.3 10*3/uL — AB (ref 1.7–7.7)
Neutrophils Relative %: 79 %
PLATELETS: 264 10*3/uL (ref 150–400)
RBC: 3.77 MIL/uL — ABNORMAL LOW (ref 3.87–5.11)
RDW: 12.8 % (ref 11.5–15.5)
WBC: 15.5 10*3/uL — ABNORMAL HIGH (ref 4.0–10.5)

## 2017-01-07 LAB — COMPREHENSIVE METABOLIC PANEL
ALK PHOS: 65 U/L (ref 38–126)
ALT: 10 U/L — ABNORMAL LOW (ref 14–54)
AST: 13 U/L — AB (ref 15–41)
Albumin: 3.1 g/dL — ABNORMAL LOW (ref 3.5–5.0)
Anion gap: 10 (ref 5–15)
BILIRUBIN TOTAL: 0.5 mg/dL (ref 0.3–1.2)
BUN: 5 mg/dL — AB (ref 6–20)
CALCIUM: 8.6 mg/dL — AB (ref 8.9–10.3)
CO2: 24 mmol/L (ref 22–32)
Chloride: 105 mmol/L (ref 101–111)
Creatinine, Ser: 0.83 mg/dL (ref 0.44–1.00)
GFR calc Af Amer: >60 mL/min (ref >60)
GFR calc non Af Amer: >60 mL/min (ref >60)
GLUCOSE: 97 mg/dL (ref 65–99)
Potassium: 3.8 mmol/L (ref 3.5–5.1)
SODIUM: 139 mmol/L (ref 135–145)
TOTAL PROTEIN: 7.1 g/dL (ref 6.5–8.1)

## 2017-01-07 LAB — PREGNANCY, URINE: Preg Test, Ur: NEGATIVE

## 2017-01-07 LAB — PROTIME-INR
INR: 1.11
Prothrombin Time: 14.3 seconds (ref 11.4–15.2)

## 2017-01-07 LAB — HIV ANTIBODY (ROUTINE TESTING W REFLEX): HIV Screen 4th Generation wRfx: NONREACTIVE

## 2017-01-07 MED ORDER — DEXTROSE 5 % IV SOLN
1.0000 g | Freq: Three times a day (TID) | INTRAVENOUS | Status: DC
  Administered 2017-01-07 – 2017-01-09 (×7): 1 g via INTRAVENOUS
  Filled 2017-01-07 (×9): qty 1

## 2017-01-07 MED ORDER — MORPHINE SULFATE (PF) 4 MG/ML IV SOLN: 2.0000 mg | INTRAVENOUS | Status: DC | PRN

## 2017-01-07 MED ORDER — SODIUM CHLORIDE 0.9 % IV SOLN
INTRAVENOUS | Status: AC
  Administered 2017-01-07: 12:00:00 via INTRAVENOUS

## 2017-01-07 MED ORDER — ENOXAPARIN SODIUM 40 MG/0.4ML ~~LOC~~ SOLN
40.0000 mg | SUBCUTANEOUS | Status: DC
  Administered 2017-01-08 – 2017-01-09 (×2): 40 mg via SUBCUTANEOUS
  Filled 2017-01-07 (×2): qty 0.4

## 2017-01-07 MED ORDER — HYDRALAZINE HCL 20 MG/ML IJ SOLN: 10.0000 mg | INTRAMUSCULAR | Status: DC | PRN

## 2017-01-07 MED ORDER — PANTOPRAZOLE SODIUM 40 MG IV SOLR
40.0000 mg | INTRAVENOUS | Status: DC
  Administered 2017-01-07 – 2017-01-09 (×3): 40 mg via INTRAVENOUS
  Filled 2017-01-07 (×3): qty 40

## 2017-01-07 NOTE — Consult Note (Signed)
Reason for Consult: Diverticulitis with abscess Referring Physician: Dr Carolin Sicks  Jasmine Cobb is an 51 y.o. female.  HPI: 51 year old African-American female admitted to the hospital yesterday with sigmoid diverticulitis with small abscess. She has a long-standing history of diverticulitis. She was recently was in the hospital from September 7 through the 14th with diverticulitis with abscess requiring percutaneous drain. She followed up in our office with one visit and was then supposed to get a colonoscopy and then come back to talk about and plan 1 stage resection. She did not follow-up afterwards. Also after that appointment she followed up with an internist in [facility] and was referred to gastroenterology but failed to keep the appointment. When asked about this she states that she was no longer interested in getting her care at [facility] or [facility] and she had transportation issues and insurance issues preventing her from getting additional follow-up care  She states that for the past month or so she has been having flares of low back pain which is where her diverticulitis pain occurs as well as suprapubic pain. She states on Easter weekend she ate some [employer] chicken and smokes some cigarettes and that is when she felt the recurrence of her typical diverticulitis pain. Since then she's been having intermittent flares however the past week has been pretty rough. She has some constipation issues at baseline and takes linzess. She denies any dysuria or hematuria or pneumaturia. She does have menses once a month regularly. Her last colonoscopy was in 2012.  White count was 17,000 on admission yesterday and down to 15 today. She states that she went from nothing by mouth to full liquids today. She had some full liquids at lunch which made her pain worse a few hours later Past Medical History:  Diagnosis Date  . Diverticulitis   . Hypertension     Past Surgical History:   Procedure Laterality Date  . IR GENERIC HISTORICAL  05/11/2016   IR CM INJ ANY COLONIC TUBE W/FLUORO 05/11/2016 [facility]-INTERV RAD  . TUBAL LIGATION      History reviewed. No pertinent family history.  Social History:  reports that she has quit smoking. Her smoking use included Cigarettes. She has never used smokeless tobacco. She reports that she drinks alcohol. She reports that she uses drugs, including Marijuana.  Allergies:  Allergies  Allergen Reactions  . Shellfish Allergy Anaphylaxis  . Ciprofloxacin Swelling  . Penicillins Other (See Comments)    unknown    Medications: I have reviewed the patient's current medications.  Results for orders placed or performed during the hospital encounter of 01/06/17 (from the past 48 hour(s))  Pregnancy, urine     Status: None   Collection Time: 01/06/17  8:50 AM  Result Value Ref Range   Preg Test, Ur NEGATIVE NEGATIVE    Comment:        THE SENSITIVITY OF THIS METHODOLOGY IS >20 mIU/mL.   Lipase, blood     Status: None   Collection Time: 01/06/17  3:55 PM  Result Value Ref Range   Lipase 15 11 - 51 U/L  Comprehensive metabolic panel     Status: Abnormal   Collection Time: 01/06/17  3:55 PM  Result Value Ref Range   Sodium 137 135 - 145 mmol/L   Potassium 3.7 3.5 - 5.1 mmol/L   Chloride 106 101 - 111 mmol/L   CO2 19 (L) 22 - 32 mmol/L   Glucose, Bld 109 (H) 65 - 99 mg/dL   BUN 5 (  L) 6 - 20 mg/dL   Creatinine, Ser 0.88 0.44 - 1.00 mg/dL   Calcium 9.1 8.9 - 10.3 mg/dL   Total Protein 8.0 6.5 - 8.1 g/dL   Albumin 3.7 3.5 - 5.0 g/dL   AST 16 15 - 41 U/L   ALT 12 (L) 14 - 54 U/L   Alkaline Phosphatase 71 38 - 126 U/L   Total Bilirubin 0.7 0.3 - 1.2 mg/dL   GFR calc non Af Amer >60 >60 mL/min   GFR calc Af Amer >60 >60 mL/min    Comment: (NOTE) The eGFR has been calculated using the CKD EPI equation. This calculation has not been validated in all clinical situations. eGFR's persistently <60 mL/min signify possible Chronic  Kidney Disease.    Anion gap 12 5 - 15  CBC     Status: Abnormal   Collection Time: 01/06/17  3:55 PM  Result Value Ref Range   WBC 17.4 (H) 4.0 - 10.5 K/uL   RBC 4.01 3.87 - 5.11 MIL/uL   Hemoglobin 13.5 12.0 - 15.0 g/dL   HCT 38.5 36.0 - 46.0 %   MCV 96.0 78.0 - 100.0 fL   MCH 33.7 26.0 - 34.0 pg   MCHC 35.1 30.0 - 36.0 g/dL   RDW 12.3 11.5 - 15.5 %   Platelets 286 150 - 400 K/uL  Urinalysis, Routine w reflex microscopic     Status: Abnormal   Collection Time: 01/06/17  4:19 PM  Result Value Ref Range   Color, Urine YELLOW YELLOW   APPearance CLEAR CLEAR   Specific Gravity, Urine 1.010 1.005 - 1.030   pH 6.0 5.0 - 8.0   Glucose, UA NEGATIVE NEGATIVE mg/dL   Hgb urine dipstick NEGATIVE NEGATIVE   Bilirubin Urine NEGATIVE NEGATIVE   Ketones, ur 20 (A) NEGATIVE mg/dL   Protein, ur NEGATIVE NEGATIVE mg/dL   Nitrite NEGATIVE NEGATIVE   Leukocytes, UA NEGATIVE NEGATIVE  I-Stat beta hCG blood, ED     Status: Abnormal   Collection Time: 01/06/17  4:29 PM  Result Value Ref Range   I-stat hCG, quantitative 13.3 (H) <5 mIU/mL   Comment 3            Comment:   GEST. AGE      CONC.  (mIU/mL)   <=1 WEEK        5 - 50     2 WEEKS       50 - 500     3 WEEKS       100 - 10,000     4 WEEKS     1,000 - 30,000        FEMALE AND NON-PREGNANT FEMALE:     LESS THAN 5 mIU/mL   HIV antibody (Routine Testing)     Status: None   Collection Time: 01/07/17  3:43 AM  Result Value Ref Range   HIV Screen 4th Generation wRfx Non Reactive Non Reactive    Comment: (NOTE) Performed At: [facility] [place], [place] [id number] Lindon Romp MD [id number]   Comprehensive metabolic panel     Status: Abnormal   Collection Time: 01/07/17  3:43 AM  Result Value Ref Range   Sodium 139 135 - 145 mmol/L   Potassium 3.8 3.5 - 5.1 mmol/L   Chloride 105 101 - 111 mmol/L   CO2 24 22 - 32 mmol/L   Glucose, Bld 97 65 - 99 mg/dL   BUN 5 (L) 6 - 20 mg/dL  Creatinine, Ser 0.83  0.44 - 1.00 mg/dL   Calcium 8.6 (L) 8.9 - 10.3 mg/dL   Total Protein 7.1 6.5 - 8.1 g/dL   Albumin 3.1 (L) 3.5 - 5.0 g/dL   AST 13 (L) 15 - 41 U/L   ALT 10 (L) 14 - 54 U/L   Alkaline Phosphatase 65 38 - 126 U/L   Total Bilirubin 0.5 0.3 - 1.2 mg/dL   GFR calc non Af Amer >60 >60 mL/min   GFR calc Af Amer >60 >60 mL/min    Comment: (NOTE) The eGFR has been calculated using the CKD EPI equation. This calculation has not been validated in all clinical situations. eGFR's persistently <60 mL/min signify possible Chronic Kidney Disease.    Anion gap 10 5 - 15  CBC WITH DIFFERENTIAL     Status: Abnormal   Collection Time: 01/07/17  3:43 AM  Result Value Ref Range   WBC 15.5 (H) 4.0 - 10.5 K/uL   RBC 3.77 (L) 3.87 - 5.11 MIL/uL   Hemoglobin 12.5 12.0 - 15.0 g/dL   HCT 36.9 36.0 - 46.0 %   MCV 97.9 78.0 - 100.0 fL   MCH 33.2 26.0 - 34.0 pg   MCHC 33.9 30.0 - 36.0 g/dL   RDW 12.8 11.5 - 15.5 %   Platelets 264 150 - 400 K/uL   Neutrophils Relative % 79 %   Neutro Abs 12.3 (H) 1.7 - 7.7 K/uL   Lymphocytes Relative 15 %   Lymphs Abs 2.4 0.7 - 4.0 K/uL   Monocytes Relative 5 %   Monocytes Absolute 0.8 0.1 - 1.0 K/uL   Eosinophils Relative 1 %   Eosinophils Absolute 0.1 0.0 - 0.7 K/uL   Basophils Relative 0 %   Basophils Absolute 0.0 0.0 - 0.1 K/uL  Protime-INR     Status: None   Collection Time: 01/07/17  3:43 AM  Result Value Ref Range   Prothrombin Time 14.3 11.4 - 15.2 seconds   INR 1.11     Ct Abdomen Pelvis W Contrast  Result Date: 01/06/2017 CLINICAL DATA:  Left lower quadrant/suprapubic pain. History of diverticulitis. EXAM: CT ABDOMEN AND PELVIS WITH CONTRAST TECHNIQUE: Multidetector CT imaging of the abdomen and pelvis was performed using the standard protocol following bolus administration of intravenous contrast. CONTRAST:  100 cc Isovue-300 IV. COMPARISON:  05/10/2016 CT abdomen/ pelvis. FINDINGS: Lower chest: No significant pulmonary nodules or acute consolidative  airspace disease. Hepatobiliary: Normal size liver. No liver mass. Normal gallbladder with no radiopaque cholelithiasis. No biliary ductal dilatation. Pancreas: Normal, with no mass or duct dilation. Spleen: Normal size. No mass. Adrenals/Urinary Tract: Normal adrenals. No renal mass. No hydronephrosis. Normal bladder. Stomach/Bowel: Grossly normal stomach. Normal caliber small bowel. Normal appendix. There is mild diverticulosis of the descending and sigmoid colon. There is prominent circumferential colonic wall thickening and wall hyperenhancement throughout the mid to distal sigmoid colon with associated prominent pericolonic fat stranding. There is a small 2.3 x 1.8 x 2.1 cm collection in the sigmoid mesentery with thick enhancing wall (series 3/ image 65), which appears to communicate with the thick-walled sigmoid colon via a small fistula tract (series 7/image 88). There are a few mildly thick walled upper pelvic small bowel loops adjacent to the sigmoid mesentery abscess. Vascular/Lymphatic: Atherosclerotic nonaneurysmal abdominal aorta. Patent portal, splenic, hepatic and renal veins. No pathologically enlarged lymph nodes in the abdomen or pelvis. 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 Ring enhancing 1.4 x 1.3 cm right adnexal structure (series  3/image 70). No left adnexal mass. Other: No pneumoperitoneum, ascites or focal fluid collection. No pneumoperitoneum. No ascites. Musculoskeletal: No aggressive appearing focal osseous lesions. Mild thoracolumbar spondylosis. IMPRESSION: 1. Prominent circumferential wall thickening, wall hyperenhancement and pericolonic fat stranding throughout the mid to distal sigmoid colon with associated mild distal colonic diverticulosis. These nonspecific findings are most consistent with acute diverticulitis superimposed on chronic sigmoid diverticulosis, although an underlying sigmoid neoplasm is difficult to exclude, and  colonoscopy should be considered after treatment of this inflammatory episode if not recently performed. 2. Small sigmoid mesentery abscess, which appears to communicate with the sigmoid colon via a small fistula tract. No free air. 3. Reactive wall thickening within pelvic small bowel loops. 4. Nonspecific ring enhancing 1.4 cm right adnexal structure, which could be physiologic if the patient is perimenopausal, with a tiny tubo-ovarian abscess not excluded. Transabdominal and transvaginal pelvic ultrasound correlation could be obtained as clinically warranted. 5. Enlarged myomatous retroverted uterus. 6. Aortic atherosclerosis . Electronically Signed   By: Ilona Sorrel M.D.   On: 01/06/2017 22:20    Review of Systems  Constitutional: Positive for fever. Negative for weight loss.  HENT: Negative for nosebleeds.   Eyes: Negative for blurred vision.  Respiratory: Negative for shortness of breath.   Cardiovascular: Negative for chest pain, palpitations, orthopnea and PND.       Denies DOE  Gastrointestinal: Positive for abdominal pain and constipation. Negative for blood in stool.  Genitourinary: Negative for dysuria and hematuria.  Musculoskeletal: Negative.   Skin: Negative for itching and rash.  Neurological: Negative for dizziness, focal weakness, seizures, loss of consciousness and headaches.       Denies TIAs, amaurosis fugax  Endo/Heme/Allergies: Does not bruise/bleed easily.  Psychiatric/Behavioral: The patient is not nervous/anxious.    Blood pressure 124/78, pulse 67, temperature 98.9 F (37.2 C), temperature source Oral, resp. rate 18, last menstrual period 12/15/2016, SpO2 100 %. Physical Exam  Vitals reviewed. Constitutional: She is oriented to person, place, and time. She appears well-developed and well-nourished. No distress.  Asleep, easily awakes; moves around in bed without discomfort.   HENT:  Head: Normocephalic and atraumatic.  Right Ear: External ear normal.  Left Ear:  External ear normal.  Eyes: Conjunctivae are normal. No scleral icterus.  Neck: Normal range of motion. Neck supple. No tracheal deviation present. No thyromegaly present.  Cardiovascular: Normal rate and normal heart sounds.   Respiratory: Effort normal and breath sounds normal. No stridor. No respiratory distress. She has no wheezes.  GI: Soft. She exhibits no distension. There is tenderness in the right lower quadrant. There is no rigidity, no rebound and no guarding.  Soft, nd, mild TTP RLQ  Musculoskeletal: She exhibits no edema or tenderness.  Lymphadenopathy:    She has no cervical adenopathy.  Neurological: She is alert and oriented to person, place, and time. She exhibits normal muscle tone.  Skin: Skin is warm and dry. No rash noted. She is not diaphoretic. No erythema. No pallor.  Psychiatric: She has a normal mood and affect. Her behavior is normal. Judgment and thought content normal.    Assessment/Plan: Sigmoid diverticulitis with abscess with some reactive small bowel loops Right 1.4 cm adnexal structure Hypertension  Her diverticulitis has been an ongoing problem. Right now she is afebrile. Resting comfortably With a nonsurgical abdomen.   Continue IV antibiotics, will back diet back down to clears. If still having difficulty with liquids tomorrow consider changing antibiotic regimen  We rediscussed typical diverticulitis management. We also  discussed the possibility of another pathology.  She requested a Education officer, museum consult to help her with transportation needs. She states this is the primary reason she has been unable to see a doctor in a while.   Ideally needs f/u colonoscopy  She is interested in definitive surgical mgmt. we discussed that ideally we like to do this in a noninfected state therefore we can do a 1 stage surgery. We also discussed the possibility that she may not get better this admission with just antibiotics alone.  We will see how she  progresses  Leighton Ruff. Redmond Pulling, MD, FACS General, Bariatric, & Minimally Invasive Surgery [facility], [place]  [facility] M 01/07/2017, 9:23 PM

## 2017-01-07 NOTE — Progress Notes (Signed)
Patient ID: Jasmine Cobb, female   DOB: 1965/11/18, 51 y.o.   MRN: [id number]  Request for drain placement for diverticular abscess Previously large enough for drain placement 2017  CT yesterday IMPRESSION: 1. Prominent circumferential wall thickening, wall hyperenhancement and pericolonic fat stranding throughout the mid to distal sigmoid colon with associated mild distal colonic diverticulosis. These nonspecific findings are most consistent with acute diverticulitis superimposed on chronic sigmoid diverticulosis, although an underlying sigmoid neoplasm is difficult to exclude, and colonoscopy should be considered after treatment of this inflammatory episode if not recently performed. 2. Small sigmoid mesentery abscess, which appears to communicate with the sigmoid colon via a small fistula tract. No free air. 3. Reactive wall thickening within pelvic small bowel loops. 4. Nonspecific ring enhancing 1.4 cm right adnexal structure, which could be physiologic if the patient is perimenopausal, with a tiny tubo-ovarian abscess not excluded. Transabdominal and transvaginal pelvic ultrasound correlation could be obtained as clinically warranted. 5. Enlarged myomatous retroverted uterus. 6. Aortic atherosclerosis   Dr Laurence Ferrari has reviewed imaging: Too small /not amenable to percutaneous drainage  Consider surgery consult  MD informed

## 2017-01-07 NOTE — Progress Notes (Signed)
Pharmacy Antibiotic Note  Jasmine Cobb is a 51 y.o. female admitted on 01/06/2017 with diverticulitis/abscess.  Pharmacy has been consulted for Cefepime dosing. WBC elevated. Renal function good.   Plan: -Cefepime 1g IV q8h -Flagyl per MD -Trend WBC, temp, renal function -F/U infectious work-up  Temp (24hrs), Avg:98.3 F (36.8 C), Min:98.1 F (36.7 C), Max:98.5 F (36.9 C)   Recent Labs Lab 01/06/17 1555  WBC 17.4*  CREATININE 0.88    CrCl cannot be calculated (Unknown ideal weight.).    Allergies  Allergen Reactions  . Shellfish Allergy Anaphylaxis  . Ciprofloxacin Swelling  . Penicillins Other (See Comments)    unknown    Narda Bonds 01/07/2017 12:43 AM

## 2017-01-07 NOTE — Progress Notes (Signed)
Patient present on floor. Report received from ED nurse. Patient alert and oriented.

## 2017-01-07 NOTE — Progress Notes (Addendum)
PROGRESS NOTE    Jasmine Cobb  HFW:[id number] DOB: 1966-03-17 DOA: 01/06/2017 PCP: System, Pcp Not In   Brief Narrative: 51 year old female with history of hypertension, diverticulitis with abscess in the past requiring drainage presented with nausea and abdominal pain on and off for about a month. In the ED patient was found to have acute diverticulitis with possible abscess.  Assessment & Plan:   #Acute diverticulitis with abscess: Very small collection to drain by IR. Spoke with the IR this morning. Consulted general surgery and I spoke with surgery PA today. Patient with leukocytosis. She has no nausea or vomiting today. Continue cefepime and Flagyl IV. I'll start liquid diet. Continue supportive care including pain medications. Monitor labs. -May need outpatient colonoscopy to rule out neoplasm as per CT scan  #History of hypertension: Blood pressure borderline. Holding losartan. Continue to monitor blood pressure.  Principal Problem:   Diverticulitis of large intestine with abscess without bleeding Active Problems:   Essential hypertension   Diverticulitis of large intestine with abscess  DVT prophylaxis: Lovenox subcutaneous Code Status: Full code Family Communication: No family at bedside Disposition Plan: Likely discharge home in 1-2 days    Consultants:   General surgery  IR  Procedures: CT scan of abdomen Antimicrobials: IV Flagyl and cefepime since May 3 in  Subjective: Patient was seen and examined at bedside. Reports abdominal pain is mainly on the left side. Denied nausea vomiting, chills, chest pain or shortness of breath today.  Objective: Vitals:   01/06/17 1807 01/06/17 1815 01/07/17 0036 01/07/17 0619  BP: 135/78 128/87 122/78 101/69  Pulse: 85 90 88 87  Resp: 18 18 19 19   Temp:   98.1 F (36.7 C) 98.1 F (36.7 C)  TempSrc:   Oral Oral  SpO2: 100% 100% 99% 99%    Intake/Output Summary (Last 24 hours) at 01/07/17 1041 Last data filed at  01/07/17 0646  Gross per 24 hour  Intake           708.33 ml  Output                0 ml  Net           708.33 ml   There were no vitals filed for this visit.  Examination:  General exam: Appears calm and comfortable  Respiratory system: Clear to auscultation. Respiratory effort normal. No wheezing or crackle Cardiovascular system: S1 & S2 heard, RRR.  No pedal edema. Gastrointestinal system: Abdomen is nondistended, soft and Mild tenderness on the left lower quadrant. Normal bowel sounds heard. Central nervous system: Alert and oriented. No focal neurological deficits. Extremities: Symmetric 5 x 5 power. Skin: No rashes, lesions or ulcers Psychiatry: Judgement and insight appear normal. Mood & affect appropriate.     Data Reviewed: I have personally reviewed following labs and imaging studies  CBC:  Recent Labs Lab 01/06/17 1555 01/07/17 0343  WBC 17.4* 15.5*  NEUTROABS  --  12.3*  HGB 13.5 12.5  HCT 38.5 36.9  MCV 96.0 97.9  PLT 286 027   Basic Metabolic Panel:  Recent Labs Lab 01/06/17 1555 01/07/17 0343  NA 137 139  K 3.7 3.8  CL 106 105  CO2 19* 24  GLUCOSE 109* 97  BUN 5* 5*  CREATININE 0.88 0.83  CALCIUM 9.1 8.6*   GFR: CrCl cannot be calculated (Unknown ideal weight.). Liver Function Tests:  Recent Labs Lab 01/06/17 1555 01/07/17 0343  AST 16 13*  ALT 12* 10*  ALKPHOS 71 65  BILITOT 0.7 0.5  PROT 8.0 7.1  ALBUMIN 3.7 3.1*    Recent Labs Lab 01/06/17 1555  LIPASE 15   No results for input(s): AMMONIA in the last 168 hours. Coagulation Profile:  Recent Labs Lab 01/07/17 0343  INR 1.11   Cardiac Enzymes: No results for input(s): CKTOTAL, CKMB, CKMBINDEX, TROPONINI in the last 168 hours. BNP (last 3 results) No results for input(s): PROBNP in the last 8760 hours. HbA1C: No results for input(s): HGBA1C in the last 72 hours. CBG: No results for input(s): GLUCAP in the last 168 hours. Lipid Profile: No results for input(s):  CHOL, HDL, LDLCALC, TRIG, CHOLHDL, LDLDIRECT in the last 72 hours. Thyroid Function Tests: No results for input(s): TSH, T4TOTAL, FREET4, T3FREE, THYROIDAB in the last 72 hours. Anemia Panel: No results for input(s): VITAMINB12, FOLATE, FERRITIN, TIBC, IRON, RETICCTPCT in the last 72 hours. Sepsis Labs: No results for input(s): PROCALCITON, LATICACIDVEN in the last 168 hours.  No results found for this or any previous visit (from the past 240 hour(s)).       Radiology Studies: Ct Abdomen Pelvis W Contrast  Result Date: 01/06/2017 CLINICAL DATA:  Left lower quadrant/suprapubic pain. History of diverticulitis. EXAM: CT ABDOMEN AND PELVIS WITH CONTRAST TECHNIQUE: Multidetector CT imaging of the abdomen and pelvis was performed using the standard protocol following bolus administration of intravenous contrast. CONTRAST:  100 cc Isovue-300 IV. COMPARISON:  05/10/2016 CT abdomen/ pelvis. FINDINGS: Lower chest: No significant pulmonary nodules or acute consolidative airspace disease. Hepatobiliary: Normal size liver. No liver mass. Normal gallbladder with no radiopaque cholelithiasis. No biliary ductal dilatation. Pancreas: Normal, with no mass or duct dilation. Spleen: Normal size. No mass. Adrenals/Urinary Tract: Normal adrenals. No renal mass. No hydronephrosis. Normal bladder. Stomach/Bowel: Grossly normal stomach. Normal caliber small bowel. Normal appendix. There is mild diverticulosis of the descending and sigmoid colon. There is prominent circumferential colonic wall thickening and wall hyperenhancement throughout the mid to distal sigmoid colon with associated prominent pericolonic fat stranding. There is a small 2.3 x 1.8 x 2.1 cm collection in the sigmoid mesentery with thick enhancing wall (series 3/ image 65), which appears to communicate with the thick-walled sigmoid colon via a small fistula tract (series 7/image 88). There are a few mildly thick walled upper pelvic small bowel loops  adjacent to the sigmoid mesentery abscess. Vascular/Lymphatic: Atherosclerotic nonaneurysmal abdominal aorta. Patent portal, splenic, hepatic and renal veins. No pathologically enlarged lymph nodes in the abdomen or pelvis. 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 Ring enhancing 1.4 x 1.3 cm right adnexal structure (series 3/image 70). No left adnexal mass. Other: No pneumoperitoneum, ascites or focal fluid collection. No pneumoperitoneum. No ascites. Musculoskeletal: No aggressive appearing focal osseous lesions. Mild thoracolumbar spondylosis. IMPRESSION: 1. Prominent circumferential wall thickening, wall hyperenhancement and pericolonic fat stranding throughout the mid to distal sigmoid colon with associated mild distal colonic diverticulosis. These nonspecific findings are most consistent with acute diverticulitis superimposed on chronic sigmoid diverticulosis, although an underlying sigmoid neoplasm is difficult to exclude, and colonoscopy should be considered after treatment of this inflammatory episode if not recently performed. 2. Small sigmoid mesentery abscess, which appears to communicate with the sigmoid colon via a small fistula tract. No free air. 3. Reactive wall thickening within pelvic small bowel loops. 4. Nonspecific ring enhancing 1.4 cm right adnexal structure, which could be physiologic if the patient is perimenopausal, with a tiny tubo-ovarian abscess not excluded. Transabdominal and transvaginal pelvic ultrasound correlation could be obtained as clinically warranted. 5.  Enlarged myomatous retroverted uterus. 6. Aortic atherosclerosis . Electronically Signed   By: Ilona Sorrel M.D.   On: 01/06/2017 22:20        Scheduled Meds: . [START ON 01/08/2017] enoxaparin (LOVENOX) injection  40 mg Subcutaneous Q24H  . pantoprazole (PROTONIX) IV  40 mg Intravenous Q24H   Continuous Infusions: . sodium chloride    . ceFEPime (MAXIPIME) IV 1 g  (01/07/17 0601)  . metronidazole Stopped (01/07/17 0704)     LOS: 1 day    Johnna Bollier Tanna Furry, MD [facility] Pager [phone]  If 7PM-7AM, please contact night-coverage [email or web address] Password [facility] 01/07/2017, 10:41 AM

## 2017-01-08 ENCOUNTER — Inpatient Hospital Stay (HOSPITAL_COMMUNITY): Payer: Medicare Other

## 2017-01-08 DIAGNOSIS — R102 Pelvic and perineal pain: Secondary | ICD-10-CM

## 2017-01-08 LAB — CBC
HEMATOCRIT: 36.2 % (ref 36.0–46.0)
HEMOGLOBIN: 12.4 g/dL (ref 12.0–15.0)
MCH: 33.2 pg (ref 26.0–34.0)
MCHC: 34.3 g/dL (ref 30.0–36.0)
MCV: 97.1 fL (ref 78.0–100.0)
Platelets: 258 10*3/uL (ref 150–400)
RBC: 3.73 MIL/uL — AB (ref 3.87–5.11)
RDW: 12.5 % (ref 11.5–15.5)
WBC: 15.9 10*3/uL — ABNORMAL HIGH (ref 4.0–10.5)

## 2017-01-08 NOTE — Progress Notes (Signed)
PROGRESS NOTE    Jasmine Cobb  MWU:[id number] DOB: [birth year] DOA: 01/06/2017 PCP: System, Pcp Not In   Brief Narrative: 51 year old female with history of hypertension, diverticulitis with abscess in the past requiring drainage presented with nausea and abdominal pain on and off for about a month. In the ED patient was found to have acute diverticulitis with possible abscess.  Assessment & Plan:   #Acute diverticulitis with abscess:  -Not amenable to drain by IR.  -Evaluated by general surgery. Recommended to continue IV antibiotics and supportive care. If no clinical improvement patient may need surgical intervention. Currently on clear liquid diet. I discussed with general surgeon today. -Continue cefepime and Flagyl IV. -Still having abdominal pain and not able to tolerate diet. Discussed with the patient at length at bedside. -Continue supportive care including pain medications. Monitor labs. -May need outpatient colonoscopy to rule out neoplasm as per CT scan  #History of hypertension: Blood pressure borderline. Continue to hold losartan. Continue to monitor blood pressure.  #Abdomen pain and back pain likely due to diverticulitis. CT scan of abdomen and pelvis showed 1.4 cm right adnexal structure which could be physiologic versus tiny tubo-ovarian abscess. Patient was concerned about the finding. Ordered ultrasound of pelvis to further evaluate. I noted that the patient has beta hcgof 13.3 however urine pregnancy test was reported as negative. Reviewed this with the patient at bedside. Follow-up ultrasound. -Urine was clear.  DVT prophylaxis: Lovenox subcutaneous Code Status: Full code Family Communication: No family at bedside Disposition Plan: Likely discharge home in 1-2 days    Consultants:   General surgery  IR  Procedures: CT scan of abdomen Antimicrobials: IV Flagyl and cefepime since May 3 in  Subjective: Patient was seen and examined at bedside. Still  reported no improvement in abdominal pain. Fear to eat because of worsening pain. Has nausea but no vomiting. Denied headache, dizziness, chest pain or shortness of breath. Objective: Vitals:   01/07/17 0619 01/07/17 1431 01/07/17 2137 01/08/17 0453  BP: 101/69 124/78  126/74  Pulse: 87 67  66  Resp: 19 18  17   Temp: 98.1 F (36.7 C) 98.9 F (37.2 C)  98.3 F (36.8 C)  TempSrc: Oral Oral  Oral  SpO2: 99% 100%  100%  Weight:   89.4 kg (197 lb)   Height:   5\' 5"  (1.651 m)     Intake/Output Summary (Last 24 hours) at 01/08/17 1055 Last data filed at 01/08/17 0454  Gross per 24 hour  Intake               60 ml  Output                0 ml  Net               60 ml   Filed Weights   01/07/17 2137  Weight: 89.4 kg (197 lb)    Examination:  General exam: Not in distress, lying in bed comfortable  Respiratory system: Clear bilateral, respiratory effort normal Cardiovascular system: Regular rate rhythm, S1-S2 normal. No pedal edema  Gastrointestinal system: Abdomen soft, left lower quadrant tenderness, nondistended. Bowel sound positive Central nervous system: Alert and oriented. No focal neurological deficits. Extremities: Symmetric 5 x 5 power. Skin: No rashes, lesions or ulcers Psychiatry: Judgement and insight appear normal. Mood & affect appropriate.     Data Reviewed: I have personally reviewed following labs and imaging studies  CBC:  Recent Labs Lab 01/06/17 1555 01/07/17 0343 01/08/17 0425  WBC 17.4* 15.5* 15.9*  NEUTROABS  --  12.3*  --   HGB 13.5 12.5 12.4  HCT 38.5 36.9 36.2  MCV 96.0 97.9 97.1  PLT 286 264 160   Basic Metabolic Panel:  Recent Labs Lab 01/06/17 1555 01/07/17 0343  NA 137 139  K 3.7 3.8  CL 106 105  CO2 19* 24  GLUCOSE 109* 97  BUN 5* 5*  CREATININE 0.88 0.83  CALCIUM 9.1 8.6*   GFR: Estimated Creatinine Clearance: 88.6 mL/min (by C-G formula based on SCr of 0.83 mg/dL). Liver Function Tests:  Recent Labs Lab  01/06/17 1555 01/07/17 0343  AST 16 13*  ALT 12* 10*  ALKPHOS 71 65  BILITOT 0.7 0.5  PROT 8.0 7.1  ALBUMIN 3.7 3.1*    Recent Labs Lab 01/06/17 1555  LIPASE 15   No results for input(s): AMMONIA in the last 168 hours. Coagulation Profile:  Recent Labs Lab 01/07/17 0343  INR 1.11   Cardiac Enzymes: No results for input(s): CKTOTAL, CKMB, CKMBINDEX, TROPONINI in the last 168 hours. BNP (last 3 results) No results for input(s): PROBNP in the last 8760 hours. HbA1C: No results for input(s): HGBA1C in the last 72 hours. CBG: No results for input(s): GLUCAP in the last 168 hours. Lipid Profile: No results for input(s): CHOL, HDL, LDLCALC, TRIG, CHOLHDL, LDLDIRECT in the last 72 hours. Thyroid Function Tests: No results for input(s): TSH, T4TOTAL, FREET4, T3FREE, THYROIDAB in the last 72 hours. Anemia Panel: No results for input(s): VITAMINB12, FOLATE, FERRITIN, TIBC, IRON, RETICCTPCT in the last 72 hours. Sepsis Labs: No results for input(s): PROCALCITON, LATICACIDVEN in the last 168 hours.  No results found for this or any previous visit (from the past 240 hour(s)).       Radiology Studies: Ct Abdomen Pelvis W Contrast  Result Date: 01/06/2017 CLINICAL DATA:  Left lower quadrant/suprapubic pain. History of diverticulitis. EXAM: CT ABDOMEN AND PELVIS WITH CONTRAST TECHNIQUE: Multidetector CT imaging of the abdomen and pelvis was performed using the standard protocol following bolus administration of intravenous contrast. CONTRAST:  100 cc Isovue-300 IV. COMPARISON:  05/10/2016 CT abdomen/ pelvis. FINDINGS: Lower chest: No significant pulmonary nodules or acute consolidative airspace disease. Hepatobiliary: Normal size liver. No liver mass. Normal gallbladder with no radiopaque cholelithiasis. No biliary ductal dilatation. Pancreas: Normal, with no mass or duct dilation. Spleen: Normal size. No mass. Adrenals/Urinary Tract: Normal adrenals. No renal mass. No  hydronephrosis. Normal bladder. Stomach/Bowel: Grossly normal stomach. Normal caliber small bowel. Normal appendix. There is mild diverticulosis of the descending and sigmoid colon. There is prominent circumferential colonic wall thickening and wall hyperenhancement throughout the mid to distal sigmoid colon with associated prominent pericolonic fat stranding. There is a small 2.3 x 1.8 x 2.1 cm collection in the sigmoid mesentery with thick enhancing wall (series 3/ image 65), which appears to communicate with the thick-walled sigmoid colon via a small fistula tract (series 7/image 88). There are a few mildly thick walled upper pelvic small bowel loops adjacent to the sigmoid mesentery abscess. Vascular/Lymphatic: Atherosclerotic nonaneurysmal abdominal aorta. Patent portal, splenic, hepatic and renal veins. No pathologically enlarged lymph nodes in the abdomen or pelvis. 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 Ring enhancing 1.4 x 1.3 cm right adnexal structure (series 3/image 70). No left adnexal mass. Other: No pneumoperitoneum, ascites or focal fluid collection. No pneumoperitoneum. No ascites. Musculoskeletal: No aggressive appearing focal osseous lesions. Mild thoracolumbar spondylosis. IMPRESSION: 1. Prominent circumferential wall thickening, wall hyperenhancement and pericolonic fat stranding  throughout the mid to distal sigmoid colon with associated mild distal colonic diverticulosis. These nonspecific findings are most consistent with acute diverticulitis superimposed on chronic sigmoid diverticulosis, although an underlying sigmoid neoplasm is difficult to exclude, and colonoscopy should be considered after treatment of this inflammatory episode if not recently performed. 2. Small sigmoid mesentery abscess, which appears to communicate with the sigmoid colon via a small fistula tract. No free air. 3. Reactive wall thickening within pelvic small bowel loops.  4. Nonspecific ring enhancing 1.4 cm right adnexal structure, which could be physiologic if the patient is perimenopausal, with a tiny tubo-ovarian abscess not excluded. Transabdominal and transvaginal pelvic ultrasound correlation could be obtained as clinically warranted. 5. Enlarged myomatous retroverted uterus. 6. Aortic atherosclerosis . Electronically Signed   By: Ilona Sorrel M.D.   On: 01/06/2017 22:20        Scheduled Meds: . enoxaparin (LOVENOX) injection  40 mg Subcutaneous Q24H  . pantoprazole (PROTONIX) IV  40 mg Intravenous Q24H   Continuous Infusions: . ceFEPime (MAXIPIME) IV Stopped (01/08/17 6151)  . metronidazole Stopped (01/08/17 0748)     LOS: 2 days    Searra Carnathan Tanna Furry, MD [facility] Pager [phone]  If 7PM-7AM, please contact night-coverage [email or web address] Password [facility] 01/08/2017, 10:55 AM

## 2017-01-08 NOTE — Progress Notes (Signed)
[facility] Progress Note     Subjective: CC: back and abdominal pain Pain is worsening and exacerbated by PO intake. Pt afraid to eat. Drinking small amounts of broth and water. Denies nausea/vomiting. Reports passing mucus per rectum.  Afebrile, VSS Objective: Vital signs in last 24 hours: Temp:  [98.3 F (36.8 C)-98.9 F (37.2 C)] 98.3 F (36.8 C) (05/14 0453) Pulse Rate:  [66-67] 66 (05/14 0453) Resp:  [17-18] 17 (05/14 0453) BP: (124-126)/(74-78) 126/74 (05/14 0453) SpO2:  [100 %] 100 % (05/14 0453) Weight:  [89.4 kg (197 lb)] 89.4 kg (197 lb) (05/13 2137) Last BM Date: 01/07/17  Intake/Output from previous day: 05/13 0701 - 05/14 0700 In: 60 [P.O.:60] Out: -  Intake/Output this shift: No intake/output data recorded.  PE: Gen:  Alert, NAD, pleasant Card:  Regular rate and rhythm, pedal pulses 2+ BL Pulm:  Normal effort, clear to auscultation bilaterally Abd: Soft, palpation of RLQ ilicits LLQ pain,  non-distended, hypoactive bowel sounds Skin: warm and dry, no rashes  Psych: A&Ox3   Lab Results:   Recent Labs  01/07/17 0343 01/08/17 0425  WBC 15.5* 15.9*  HGB 12.5 12.4  HCT 36.9 36.2  PLT 264 258   BMET  Recent Labs  01/06/17 1555 01/07/17 0343  NA 137 139  K 3.7 3.8  CL 106 105  CO2 19* 24  GLUCOSE 109* 97  BUN 5* 5*  CREATININE 0.88 0.83  CALCIUM 9.1 8.6*   PT/INR  Recent Labs  01/07/17 0343  LABPROT 14.3  INR 1.11   CMP     Component Value Date/Time   NA 139 01/07/2017 0343   K 3.8 01/07/2017 0343   CL 105 01/07/2017 0343   CO2 24 01/07/2017 0343   GLUCOSE 97 01/07/2017 0343   BUN 5 (L) 01/07/2017 0343   CREATININE 0.83 01/07/2017 0343   CALCIUM 8.6 (L) 01/07/2017 0343   PROT 7.1 01/07/2017 0343   ALBUMIN 3.1 (L) 01/07/2017 0343   AST 13 (L) 01/07/2017 0343   ALT 10 (L) 01/07/2017 0343   ALKPHOS 65 01/07/2017 0343   BILITOT 0.5 01/07/2017 0343   GFRNONAA >60 01/07/2017 0343   GFRAA >60 01/07/2017 0343    Lipase     Component Value Date/Time   LIPASE 15 01/06/2017 1555       Studies/Results: Ct Abdomen Pelvis W Contrast  Result Date: 01/06/2017 CLINICAL DATA:  Left lower quadrant/suprapubic pain. History of diverticulitis. EXAM: CT ABDOMEN AND PELVIS WITH CONTRAST TECHNIQUE: Multidetector CT imaging of the abdomen and pelvis was performed using the standard protocol following bolus administration of intravenous contrast. CONTRAST:  100 cc Isovue-300 IV. COMPARISON:  05/10/2016 CT abdomen/ pelvis. FINDINGS: Lower chest: No significant pulmonary nodules or acute consolidative airspace disease. Hepatobiliary: Normal size liver. No liver mass. Normal gallbladder with no radiopaque cholelithiasis. No biliary ductal dilatation. Pancreas: Normal, with no mass or duct dilation. Spleen: Normal size. No mass. Adrenals/Urinary Tract: Normal adrenals. No renal mass. No hydronephrosis. Normal bladder. Stomach/Bowel: Grossly normal stomach. Normal caliber small bowel. Normal appendix. There is mild diverticulosis of the descending and sigmoid colon. There is prominent circumferential colonic wall thickening and wall hyperenhancement throughout the mid to distal sigmoid colon with associated prominent pericolonic fat stranding. There is a small 2.3 x 1.8 x 2.1 cm collection in the sigmoid mesentery with thick enhancing wall (series 3/ image 65), which appears to communicate with the thick-walled sigmoid colon via a small fistula tract (series 7/image 88). There are a few mildly  thick walled upper pelvic small bowel loops adjacent to the sigmoid mesentery abscess. Vascular/Lymphatic: Atherosclerotic nonaneurysmal abdominal aorta. Patent portal, splenic, hepatic and renal veins. No pathologically enlarged lymph nodes in the abdomen or pelvis. 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 Ring enhancing 1.4 x 1.3 cm right adnexal structure (series 3/image 70). No left adnexal  mass. Other: No pneumoperitoneum, ascites or focal fluid collection. No pneumoperitoneum. No ascites. Musculoskeletal: No aggressive appearing focal osseous lesions. Mild thoracolumbar spondylosis. IMPRESSION: 1. Prominent circumferential wall thickening, wall hyperenhancement and pericolonic fat stranding throughout the mid to distal sigmoid colon with associated mild distal colonic diverticulosis. These nonspecific findings are most consistent with acute diverticulitis superimposed on chronic sigmoid diverticulosis, although an underlying sigmoid neoplasm is difficult to exclude, and colonoscopy should be considered after treatment of this inflammatory episode if not recently performed. 2. Small sigmoid mesentery abscess, which appears to communicate with the sigmoid colon via a small fistula tract. No free air. 3. Reactive wall thickening within pelvic small bowel loops. 4. Nonspecific ring enhancing 1.4 cm right adnexal structure, which could be physiologic if the patient is perimenopausal, with a tiny tubo-ovarian abscess not excluded. Transabdominal and transvaginal pelvic ultrasound correlation could be obtained as clinically warranted. 5. Enlarged myomatous retroverted uterus. 6. Aortic atherosclerosis . Electronically Signed   By: Ilona Sorrel M.D.   On: 01/06/2017 22:20    Anti-infectives: Anti-infectives    Start     Dose/Rate Route Frequency Ordered Stop   01/07/17 0700  metroNIDAZOLE (FLAGYL) IVPB 500 mg     500 mg 100 mL/hr over 60 Minutes Intravenous Every 8 hours 01/06/17 2352     01/07/17 0600  ceFEPIme (MAXIPIME) 1 g in dextrose 5 % 50 mL IVPB     1 g 100 mL/hr over 30 Minutes Intravenous Every 8 hours 01/07/17 0043     01/06/17 2315  ceFEPIme (MAXIPIME) 2 g in dextrose 5 % 50 mL IVPB     2 g 100 mL/hr over 30 Minutes Intravenous  Once 01/06/17 2302 01/07/17 0009   01/06/17 2315  metroNIDAZOLE (FLAGYL) IVPB 500 mg     500 mg 100 mL/hr over 60 Minutes Intravenous  Once 01/06/17 2302  01/07/17 0039     Assessment/Plan Recurrent sigmoid diverticulitis with abscess and reactive small bowel loops - abscess not amenable to IR drainage  - continue clear liquid diet  - WBC 15.9 from 15.5, continue IV abx; CBC in AM - If acute infection resolves non-operatively, will require outpatient colonoscopy and surgical follow up for elective sigmoid colectomy. If patient clinically worsens she will require sigmoid colectomy and likely colostomy.   ID: cefepime/flagyl 5/12 >>  VTE: Lovenox, SCD's     LOS: 2 days    Jill Alexanders , [facility] Surgery 01/08/2017, 9:37 AM Pager: [phone] Consults: [phone] Mon-Fri 7:00 am-4:30 pm Sat-Sun 7:00 am-11:30 am

## 2017-01-08 NOTE — Progress Notes (Signed)
   01/08/17 0950  Clinical Encounter Type  Visited With Patient  Visit Type Other (Comment) ([place] consult)  Spiritual Encounters  Spiritual Needs Prayer;Brochure  Stress Factors  Patient Stress Factors Health changes  Introduction to Pt. Provided and explained Advanced Directive. Listened to her story. Offered prayer of hope and comfort.

## 2017-01-08 NOTE — Progress Notes (Signed)
RN paged MD due to patient's IV infiltrating three times in 48 hours. Awaiting a return call.

## 2017-01-08 NOTE — Care Management Note (Signed)
Case Management Note  Patient Details  Name: Jasmine Cobb MRN: [id number] Date of Birth: 1966/07/28  Subjective/Objective:                    Action/Plan:  Will continue to follow Expected Discharge Date:                  Expected Discharge Plan:  Home/Self Care  In-House Referral:     Discharge planning Services     Post Acute Care Choice:    Choice offered to:     DME Arranged:    DME Agency:     HH Arranged:    [place] Agency:     Status of Service:  In process, will continue to follow  If discussed at Long Length of Stay Meetings, dates discussed:    Additional Comments:  Marilu Favre, RN 01/08/2017, 11:18 AM

## 2017-01-09 LAB — CBC
HEMATOCRIT: 36.5 % (ref 36.0–46.0)
HEMOGLOBIN: 12.2 g/dL (ref 12.0–15.0)
MCH: 32.1 pg (ref 26.0–34.0)
MCHC: 33.4 g/dL (ref 30.0–36.0)
MCV: 96.1 fL (ref 78.0–100.0)
PLATELETS: 244 10*3/uL (ref 150–400)
RBC: 3.8 MIL/uL — ABNORMAL LOW (ref 3.87–5.11)
RDW: 12.4 % (ref 11.5–15.5)
WBC: 9.7 10*3/uL (ref 4.0–10.5)

## 2017-01-09 LAB — HCG, SERUM, QUALITATIVE: Preg, Serum: NEGATIVE

## 2017-01-09 MED ORDER — LACTINEX PO CHEW: 1.0000 | CHEWABLE_TABLET | Freq: Three times a day (TID) | ORAL | 0 refills | Status: DC

## 2017-01-09 MED ORDER — SULFAMETHOXAZOLE-TRIMETHOPRIM 800-160 MG PO TABS: 1.0000 | ORAL_TABLET | Freq: Two times a day (BID) | ORAL | 0 refills | Status: AC

## 2017-01-09 MED ORDER — METRONIDAZOLE 500 MG PO TABS: 500.0000 mg | ORAL_TABLET | Freq: Three times a day (TID) | ORAL | 0 refills | Status: AC

## 2017-01-09 MED ORDER — ONDANSETRON HCL 4 MG PO TABS: 4.0000 mg | ORAL_TABLET | Freq: Every day | ORAL | 0 refills | Status: DC | PRN

## 2017-01-09 NOTE — Progress Notes (Signed)
Pt verbalized of feeling a lot better today. Tolerated soft diet. Denies abd pain, no nausea, no vomiting.  Discharge instructions given to pt, verbalized understanding. Discharged home.

## 2017-01-09 NOTE — Clinical Social Work Note (Signed)
Clinical Social Work Assessment  Patient Details  Name: Jasmine Cobb MRN: [id number] Date of Birth: [birth year]  Date of referral:  01/09/17               Reason for consult:  Transportation                Permission sought to share information with:  Other Permission granted to share information::  No  Name::        Agency::     Relationship::     Contact Information:     Housing/Transportation Living arrangements for the past 2 months:  Apartment ([facility] [facility]) Source of Information:  Patient Patient Interpreter Needed:  None Criminal Activity/Legal Involvement Pertinent to Current Situation/Hospitalization:  No - Comment as needed Significant Relationships:  Adult Children Lives with:  Self Do you feel safe going back to the place where you live?  Yes Need for family participation in patient care:  No (Coment)  Care giving concerns:  No care giving concerns identified.    Social Worker assessment / plan:  CSW met with pt chair side to address consult for "Has questions about insurance." RN Marissa also verbally consulted CSW for "transportation assistance." CSW introduced self and allowed pt to voice her concerns. Pt lives in [place], but states she's had negative experiences with [facility] and she was "kicked out and could have died." Pt comes to [facility] and utilizes [facility] as she feels she is receiving the best care here. Pt's car is currently not working and pt receives disability income each month. Pt states she has put in an application to [employer] twice. Due to the procedures pt will undergo in the coming months, pt asked if there is any other transportation assistance available that can transport between [place] and [place].   CSW explained that services such as [employer] ([place]) or [employer] [facility] [place]) are residency-specific and typically will not travel any further than a specified radius  outside of the respective city. Pt cut CSW off mid-sentence, asking why CSW let pt continue telling her story if there was nothing CSW could do for pt. CSW tried to explain that CSW wanted to assess the situation and allow pt to voice concerns before making a determination about resource availability. Pt became agitated and stated CSW should have cut her off before she went into her story and just told her there was nothing CSW could do." CSW attempted to deescalate pt, but pt was upset that CSW could not offer resources for her request. CSW attempted to provide a listing of low-income housing and assure pt has a way to get home today. Pt stated she already has the apt listing and she has fare for the bus. Pt stated she knows CSW can assist with bus for today, but that's not what she needs. Pt expressed no other needs. CSW signing off as no further SW needs identified.   Employment status:  Disabled (Comment on whether or not currently receiving Disability) Insurance information:  Medicare, Medicaid In [place] PT Recommendations:  Not assessed at this time Information / Referral to community resources:  Other (Comment Required) (Tranportation assistance)  Patient/Family's Response to care:  Pt was very pleased with the care received at [facility].  Patient/Family's Understanding of and Emotional Response to Diagnosis, Current Treatment, and Prognosis:  Pt understands diagnosis, prognosis, and current treatment well. Pt is fearful that she may die from her illness if she does not received the  proper care.   Emotional Assessment Appearance:  Appears stated age Attitude/Demeanor/Rapport:  Other (Appropriate) Affect (typically observed):  Agitated Orientation:  Oriented to Self, Oriented to Place, Oriented to  Time, Oriented to Situation Alcohol / Substance use:  Other Psych involvement (Current and /or in the community):  No (Comment)  Discharge Needs  Concerns to be addressed:  Other (Comment  Required (Transportation assistance) Readmission within the last 30 days:  No Current discharge risk:  Chronically ill, Lives alone (Lack of transportation to [place] from [place] for medical appointments) Barriers to Discharge:  Continued Medical Work up   [employer], LCSW 01/09/2017, 2:13 PM

## 2017-01-09 NOTE — Progress Notes (Signed)
[facility] Progress Note     Subjective: CC: recurrent diverticulitis  Patient denies pain in her back/abdomen today. Reports tolerating her entire clear liquid tray this AM without pain. Having bowel movements that are brown and formed and denies fever, chills, nausea, or vomiting.   Objective: Vital signs in last 24 hours: Temp:  [98.4 F (36.9 C)-100.1 F (37.8 C)] 98.4 F (36.9 C) (05/15 0526) Pulse Rate:  [80-88] 88 (05/15 0526) Resp:  [19] 19 (05/15 0526) BP: (117-126)/(70-84) 126/70 (05/15 0526) SpO2:  [100 %] 100 % (05/15 0526) Last BM Date: 01/08/17  Intake/Output from previous day: 05/14 0701 - 05/15 0700 In: 900 [IV Piggyback:900] Out: 800 [Urine:800] Intake/Output this shift: No intake/output data recorded.  PE: Gen:  Alert, NAD, pleasant Card:  Regular rate and rhythm, pedal pulses 2+ BL Pulm:  Normal effort, clear to auscultation bilaterally Abd: Soft, non-tender,  non-distended,  bowel sounds present in all 4 quadrants. Skin: warm and dry, no rashes  Psych: A&Ox3   Lab Results:   Recent Labs  01/08/17 0425 01/09/17 0351  WBC 15.9* 9.7  HGB 12.4 12.2  HCT 36.2 36.5  PLT 258 244   BMET  Recent Labs  01/06/17 1555 01/07/17 0343  NA 137 139  K 3.7 3.8  CL 106 105  CO2 19* 24  GLUCOSE 109* 97  BUN 5* 5*  CREATININE 0.88 0.83  CALCIUM 9.1 8.6*   PT/INR  Recent Labs  01/07/17 0343  LABPROT 14.3  INR 1.11   CMP     Component Value Date/Time   NA 139 01/07/2017 0343   K 3.8 01/07/2017 0343   CL 105 01/07/2017 0343   CO2 24 01/07/2017 0343   GLUCOSE 97 01/07/2017 0343   BUN 5 (L) 01/07/2017 0343   CREATININE 0.83 01/07/2017 0343   CALCIUM 8.6 (L) 01/07/2017 0343   PROT 7.1 01/07/2017 0343   ALBUMIN 3.1 (L) 01/07/2017 0343   AST 13 (L) 01/07/2017 0343   ALT 10 (L) 01/07/2017 0343   ALKPHOS 65 01/07/2017 0343   BILITOT 0.5 01/07/2017 0343   GFRNONAA >60 01/07/2017 0343   GFRAA >60 01/07/2017 0343   Lipase      Component Value Date/Time   LIPASE 15 01/06/2017 1555   Studies/Results: US Transvaginal Non-ob  Result Date: 01/08/2017 CLINICAL DATA:  Diverticulitis. Question RIGHT adnexal tubo-ovarian abscess on CT. EXAM: TRANSABDOMINAL AND TRANSVAGINAL ULTRASOUND OF PELVIS TECHNIQUE: Both transabdominal and transvaginal ultrasound examinations of the pelvis were performed. Transabdominal technique was performed for global imaging of the pelvis including uterus, ovaries, adnexal regions, and pelvic cul-de-sac. It was necessary to proceed with endovaginal exam following the transabdominal exam to visualize the RIGHT adnexa. COMPARISON:  CT 01/06/2017 FINDINGS: Uterus Measurements: Retroverted uterus contains 3 round fibroids. Uterus measures 8.6 x 5 point 6.6 cm. Fibroids measure 2-3 cm. Endometrium Thickness: Normal thickness  6 mm.  Normal premenopausal thickness Right ovary Measurements: Normal size at 3.5 x 2.4 x 4.0 cm. 2.4 cm hypoechoic structure within the RIGHT ovary likely represents a cyst. Normal color Doppler flow. Left ovary Measurements: Normal in size at 2.3 x 1.8 x 2.4 cm. Normal small follicles. Normal color Doppler flow Other findings There is a loop of thickened bowel within the RIGHT adnexal region. IMPRESSION: 1. No evidence of ovarian torsion or tubo-ovarian abscess. 2. Fibroid uterus. 3. Thickened loop of small bowel extends into the RIGHT adnexal region. Electronically Signed   By: Suzy Bouchard M.D.   On: 01/08/2017 13:14  US Pelvis Complete  Result Date: 01/08/2017 CLINICAL DATA:  Diverticulitis. Question RIGHT adnexal tubo-ovarian abscess on CT. EXAM: TRANSABDOMINAL AND TRANSVAGINAL ULTRASOUND OF PELVIS TECHNIQUE: Both transabdominal and transvaginal ultrasound examinations of the pelvis were performed. Transabdominal technique was performed for global imaging of the pelvis including uterus, ovaries, adnexal regions, and pelvic cul-de-sac. It was necessary to proceed with endovaginal  exam following the transabdominal exam to visualize the RIGHT adnexa. COMPARISON:  CT 01/06/2017 FINDINGS: Uterus Measurements: Retroverted uterus contains 3 round fibroids. Uterus measures 8.6 x 5 point 6.6 cm. Fibroids measure 2-3 cm. Endometrium Thickness: Normal thickness  6 mm.  Normal premenopausal thickness Right ovary Measurements: Normal size at 3.5 x 2.4 x 4.0 cm. 2.4 cm hypoechoic structure within the RIGHT ovary likely represents a cyst. Normal color Doppler flow. Left ovary Measurements: Normal in size at 2.3 x 1.8 x 2.4 cm. Normal small follicles. Normal color Doppler flow Other findings There is a loop of thickened bowel within the RIGHT adnexal region. IMPRESSION: 1. No evidence of ovarian torsion or tubo-ovarian abscess. 2. Fibroid uterus. 3. Thickened loop of small bowel extends into the RIGHT adnexal region. Electronically Signed   By: Suzy Bouchard M.D.   On: 01/08/2017 13:14   Anti-infectives: Anti-infectives    Start     Dose/Rate Route Frequency Ordered Stop   01/07/17 0700  metroNIDAZOLE (FLAGYL) IVPB 500 mg     500 mg 100 mL/hr over 60 Minutes Intravenous Every 8 hours 01/06/17 2352     01/07/17 0600  ceFEPIme (MAXIPIME) 1 g in dextrose 5 % 50 mL IVPB     1 g 100 mL/hr over 30 Minutes Intravenous Every 8 hours 01/07/17 0043     01/06/17 2315  ceFEPIme (MAXIPIME) 2 g in dextrose 5 % 50 mL IVPB     2 g 100 mL/hr over 30 Minutes Intravenous  Once 01/06/17 2302 01/07/17 0009   01/06/17 2315  metroNIDAZOLE (FLAGYL) IVPB 500 mg     500 mg 100 mL/hr over 60 Minutes Intravenous  Once 01/06/17 2302 01/07/17 0039     Assessment/Plan Recurrent sigmoid diverticulitis with abscess and reactive small bowel loops - clinically improving. Non-tender, tolerating clears, mobilizing, having bowel movements - Leukocytosis resolved on IV cefepime/flagyl  - Agree with advancement of diet to SOFT and PM discharge. - discharge on 10 days of BACTRIM due to above allergies causing rash.  -  may follow up with Dr. Redmond Pulling in 4 weeks for surgical planning of elective sigmoid colectomy.     LOS: 3 days    Jill Alexanders , [facility] 01/09/2017, 10:09 AM Pager: [phone] Consults: [phone] Mon-Fri 7:00 am-4:30 pm Sat-Sun 7:00 am-11:30 am

## 2017-01-09 NOTE — Progress Notes (Signed)
Pharmacy Antibiotic Note  Jasmine Cobb is a 51 y.o. female admitted on 01/06/2017 with diverticulitis/abscess.  Pharmacy has been consulted for Cefepime dosing.  Patient is also on Flagyl.  Her renal function has been stable.  Tmax 100.1 and WBC normalized.  Plan: - Continue cefepime 1g IV Q8H - Flagyl 500mg  IV Q8H per MD - Pharmacy will sign off as dosage adjustment is likely unnecessary.  Thank you for the consult!   Temp (24hrs), Avg:99.1 F (37.3 C), Min:98.4 F (36.9 C), Max:100.1 F (37.8 C)   Recent Labs Lab 01/06/17 1555 01/07/17 0343 01/08/17 0425 01/09/17 0351  WBC 17.4* 15.5* 15.9* 9.7  CREATININE 0.88 0.83  --   --     Estimated Creatinine Clearance: 88.6 mL/min (by C-G formula based on SCr of 0.83 mg/dL).    Allergies  Allergen Reactions  . Shellfish Allergy Anaphylaxis  . Ciprofloxacin Swelling  . Penicillins Other (See Comments)    unknown    Cefepime 5/13 >> Flagyl 5/13 >>   Jowanna Loeffler D. Mina Marble, PharmD, BCPS Pager:  [phone] 01/09/2017, 8:20 AM

## 2017-01-09 NOTE — Discharge Instructions (Signed)

## 2017-01-09 NOTE — Discharge Summary (Addendum)
Physician Discharge Summary  Jasmine Cobb TXM:[id number] DOB: Oct 16, 1965 DOA: 01/06/2017  PCP: System, Pcp Not In  Admit date: 01/06/2017 Discharge date: 01/09/2017  Admitted From:home Disposition: home  Recommendations for Outpatient Follow-up:  1. Follow up with PCP in 1-2 weeks 2. Please obtain BMP/CBC in one week  Home Health:no Equipment/Devices:no Discharge Condition:stable CODE STATUS:full code Diet recommendation:soft heart healthy  Brief/Interim Summary: 51 year old female with history of hypertension, diverticulitis with abscess in the past requiring drainage presented with nausea and abdominal pain on and off for about a month. In the ED patient was found to have acute diverticulitis with possible abscess.  #Acute diverticulitis with abscess:  -Not amenable to drain by IR.  -Evaluated by general surgery.Treated with IV cefepime and Flagyl with significant clinical improvement. Leukocytosis improved. Patient tolerated liquid diet very well. His morning she was walking in the room and reported feeling back to baseline. Denied nausea vomiting or abdominal pain. She reported hungry and has plan for outpatient follow-up and transportation already. Eager to go home. I discussed with the general surgery who agreed with discharge. They recommended to discharge with oral Bactrim. I added Flagyl for GI anaerobic coverage. I advised patient to continue to follow-up with general surgery outpatient for possible surgical intervention. She verbalized understanding. I also discussed with the patient that she may need outpatient colonoscopy to further evaluate the abnormal CT scan finding. -Added probiotics  #History of hypertension: Resume home medications. Recommended to monitor blood pressure home and follow up with PCP.Marland Kitchen  #Abdomen pain and back pain likely due to diverticulitis. CT scan of abdomen and pelvis showed 1.4 cm right adnexal structure which could be physiologic versus tiny  tubo-ovarian abscess. Ultrasound of pelvis unremarkable actually uterine fibroid. Patient is overall clinically improved. Recommended outpatient follow-up.  Discharge Diagnoses:  Principal Problem:   Diverticulitis of large intestine with abscess without bleeding Active Problems:   Essential hypertension   Diverticulitis of large intestine with abscess   Pelvic pain    Discharge Instructions  Discharge Instructions    Call MD for:  difficulty breathing, headache or visual disturbances    Complete by:  As directed    Call MD for:  extreme fatigue    Complete by:  As directed    Call MD for:  hives    Complete by:  As directed    Call MD for:  persistant dizziness or light-headedness    Complete by:  As directed    Call MD for:  persistant nausea and vomiting    Complete by:  As directed    Call MD for:  severe uncontrolled pain    Complete by:  As directed    Call MD for:  temperature >100.4    Complete by:  As directed    Diet - low sodium heart healthy    Complete by:  As directed    Soft diet.   Increase activity slowly    Complete by:  As directed      Allergies as of 01/09/2017      Reactions   Shellfish Allergy Anaphylaxis   Ciprofloxacin Swelling   Penicillins Other (See Comments)   unknown      Medication List    TAKE these medications   lactobacillus acidophilus & bulgar chewable tablet Chew 1 tablet by mouth 3 (three) times daily with meals.   LINZESS 145 MCG Caps capsule Generic drug:  linaclotide Take 145 mcg by mouth daily.   losartan 25 MG tablet Commonly known as:  COZAAR Take 25 mg by mouth daily.   metroNIDAZOLE 500 MG tablet Commonly known as:  FLAGYL Take 1 tablet (500 mg total) by mouth 3 (three) times daily.   ondansetron 4 MG tablet Commonly known as:  ZOFRAN Take 1 tablet (4 mg total) by mouth daily as needed for nausea or vomiting.   polyethylene glycol packet Commonly known as:  MIRALAX / GLYCOLAX Take 17 g by mouth daily as  needed for mild constipation.   sulfamethoxazole-trimethoprim 800-160 MG tablet Commonly known as:  BACTRIM DS,SEPTRA DS Take 1 tablet by mouth 2 (two) times daily.   vitamin C 500 MG tablet Commonly known as:  ASCORBIC ACID Take 500 mg by mouth daily.      Follow-up Information    Greer Pickerel, MD. Schedule an appointment as soon as possible for a visit in 4 week(s).   Specialty:  General Surgery Why:  for follow up from your recent hospitalization and discussion about elective surgery  Contact information: [address] [place] [address] [phone]        [facility]. Schedule an appointment as soon as possible for a visit in 2 week(s).   Contact information: [place] [address] [phone]         Allergies  Allergen Reactions  . Shellfish Allergy Anaphylaxis  . Ciprofloxacin Swelling  . Penicillins Other (See Comments)    unknown    Consultations: General surgery IR  Procedures/Studies: None  Subjective: Denied nausea vomiting chest pain abdominal pain. Tolerating diet well. Reported feeling back to her baseline.  Discharge Exam: Vitals:   01/08/17 2018 01/09/17 0526  BP: 125/84 126/70  Pulse: 80 88  Resp: 19 19  Temp: 98.9 F (37.2 C) 98.4 F (36.9 C)   Vitals:   01/08/17 0453 01/08/17 1545 01/08/17 2018 01/09/17 0526  BP: 126/74 117/81 125/84 126/70  Pulse: 66 80 80 88  Resp: 17  19 19   Temp: 98.3 F (36.8 C) 100.1 F (37.8 C) 98.9 F (37.2 C) 98.4 F (36.9 C)  TempSrc: Oral Oral Oral Oral  SpO2: 100% 100% 100% 100%  Weight:      Height:        General: Pt is alert, awake, not in acute distress Cardiovascular: RRR, S1/S2 +, no rubs, no gallops Respiratory: CTA bilaterally, no wheezing, no rhonchi Abdominal: Soft, NT, ND, bowel sounds + Extremities: no edema, no cyanosis    The results of significant diagnostics from this hospitalization  (including imaging, microbiology, ancillary and laboratory) are listed below for reference.     Microbiology: No results found for this or any previous visit (from the past 240 hour(s)).   Labs: BNP (last 3 results) No results for input(s): BNP in the last 8760 hours. Basic Metabolic Panel:  Recent Labs Lab 01/06/17 1555 01/07/17 0343  NA 137 139  K 3.7 3.8  CL 106 105  CO2 19* 24  GLUCOSE 109* 97  BUN 5* 5*  CREATININE 0.88 0.83  CALCIUM 9.1 8.6*   Liver Function Tests:  Recent Labs Lab 01/06/17 1555 01/07/17 0343  AST 16 13*  ALT 12* 10*  ALKPHOS 71 65  BILITOT 0.7 0.5  PROT 8.0 7.1  ALBUMIN 3.7 3.1*    Recent Labs Lab 01/06/17 1555  LIPASE 15   No results for input(s): AMMONIA in the last 168 hours. CBC:  Recent Labs Lab 01/06/17 1555 01/07/17 0343 01/08/17 0425 01/09/17 0351  WBC 17.4*  15.5* 15.9* 9.7  NEUTROABS  --  12.3*  --   --   HGB 13.5 12.5 12.4 12.2  HCT 38.5 36.9 36.2 36.5  MCV 96.0 97.9 97.1 96.1  PLT 286 264 258 244   Cardiac Enzymes: No results for input(s): CKTOTAL, CKMB, CKMBINDEX, TROPONINI in the last 168 hours. BNP: Invalid input(s): POCBNP CBG: No results for input(s): GLUCAP in the last 168 hours. D-Dimer No results for input(s): DDIMER in the last 72 hours. Hgb A1c No results for input(s): HGBA1C in the last 72 hours. Lipid Profile No results for input(s): CHOL, HDL, LDLCALC, TRIG, CHOLHDL, LDLDIRECT in the last 72 hours. Thyroid function studies No results for input(s): TSH, T4TOTAL, T3FREE, THYROIDAB in the last 72 hours.  Invalid input(s): FREET3 Anemia work up No results for input(s): VITAMINB12, FOLATE, FERRITIN, TIBC, IRON, RETICCTPCT in the last 72 hours. Urinalysis    Component Value Date/Time   COLORURINE YELLOW 01/06/2017 1619   APPEARANCEUR CLEAR 01/06/2017 1619   LABSPEC 1.010 01/06/2017 1619   PHURINE 6.0 01/06/2017 1619   GLUCOSEU NEGATIVE 01/06/2017 1619   HGBUR NEGATIVE 01/06/2017 1619    BILIRUBINUR NEGATIVE 01/06/2017 1619   KETONESUR 20 (A) 01/06/2017 1619   PROTEINUR NEGATIVE 01/06/2017 1619   NITRITE NEGATIVE 01/06/2017 1619   LEUKOCYTESUR NEGATIVE 01/06/2017 1619   Sepsis Labs Invalid input(s): PROCALCITONIN,  WBC,  LACTICIDVEN Microbiology No results found for this or any previous visit (from the past 240 hour(s)).   Time coordinating discharge: 31 minutes  SIGNED:   Rosita Fire, MD  [facility] 01/09/2017, 12:37 PM  If 7PM-7AM, please contact night-coverage [email or web address] Password [email or web address]

## 2017-01-18 ENCOUNTER — Encounter: Payer: Self-pay | Admitting: Internal Medicine

## 2017-01-18 ENCOUNTER — Ambulatory Visit: Payer: Medicare Other | Attending: Internal Medicine | Admitting: Internal Medicine

## 2017-01-18 VITALS — BP 118/81 | HR 66 | Temp 98.1°F | Resp 16 | Ht 65.0 in | Wt 193.6 lb

## 2017-01-18 DIAGNOSIS — Z1239 Encounter for other screening for malignant neoplasm of breast: Secondary | ICD-10-CM

## 2017-01-18 DIAGNOSIS — Z716 Tobacco abuse counseling: Secondary | ICD-10-CM | POA: Insufficient documentation

## 2017-01-18 DIAGNOSIS — Z1231 Encounter for screening mammogram for malignant neoplasm of breast: Secondary | ICD-10-CM | POA: Diagnosis not present

## 2017-01-18 DIAGNOSIS — I10 Essential (primary) hypertension: Secondary | ICD-10-CM

## 2017-01-18 DIAGNOSIS — K5720 Diverticulitis of large intestine with perforation and abscess without bleeding: Secondary | ICD-10-CM

## 2017-01-18 DIAGNOSIS — Z87891 Personal history of nicotine dependence: Secondary | ICD-10-CM | POA: Insufficient documentation

## 2017-01-18 DIAGNOSIS — Z1211 Encounter for screening for malignant neoplasm of colon: Secondary | ICD-10-CM | POA: Diagnosis not present

## 2017-01-18 DIAGNOSIS — D259 Leiomyoma of uterus, unspecified: Secondary | ICD-10-CM | POA: Diagnosis not present

## 2017-01-18 NOTE — Patient Instructions (Signed)
Try to limit consumption of red meat and sugar snacks. Try to eat health balance meals and exercise 3-4 x a week for 30 minutes. Continue to remain tobacco free.  Let me know if you have cravings as we can use nicotine replacement products like the patches  You have been referred for mammogram and colonoscopy.

## 2017-01-18 NOTE — Progress Notes (Signed)
Patient ID: Jasmine Cobb, female    DOB: [birth year]  MRN: [id number]  CC: Establish Care and Hospitalization Follow-up (Diverticulitis)   Subjective: Jasmine Cobb is a 51 y.o. female who presents for hosp f/u for acute diverticulitis.  New to this practice Her concerns today include:  [place]  5/12-15/2018 at [facility] course copied below:  #Acute diverticulitis with abscess:  -Not amenableto drain by IR.  -Evaluated by general surgery.Treated with IV cefepime and Flagyl with significant clinical improvement. Leukocytosis improved. Patient tolerated liquid diet very well. His morning she was walking in the room and reported feeling back to baseline. Denied nausea vomiting or abdominal pain. She reported hungry and has plan for outpatient follow-up and transportation already. Eager to go home. I discussed with the general surgery who agreed with discharge. They recommended to discharge with oral Bactrim. I added Flagyl for GI anaerobic coverage. I advised patient to continue to follow-up with general surgery outpatient for possible surgical intervention. She verbalized understanding. I also discussed with the patient that she may need outpatient colonoscopy to further evaluate the abnormal CT scan finding. -Added probiotics  #History of hypertension: Resume home medications. Recommended to monitor blood pressure home and follow up with PCP.Marland Kitchen  #Abdomen pain and back pain likely due to diverticulitis. CT scan of abdomen and pelvis showed 1.4 cm right adnexal structure which could be physiologic versus tiny tubo-ovarian abscess. Ultrasound of pelvis unremarkable actually uterine fibroid. Patient is overall clinically improved. Recommended outpatient follow-up.  3 hospitalizations in past 2 yrs for same. 04/2016 episode required drainage.  Seen by surgeon Dr. Redmond Pulling during this last hospitalization and discussions were had about elective sigmoid colectomy.  Today doing better. -dull achy  pain in lower back but much better -has 1 more day of Flagyl and Bactrim -no fever; eating without pain -plan for f/u with surgeon, Dr. Arbutus Ped, on June 13th to discuss elective sigmoid colectomy -needs colonoscopy  Incidental finding of 3 uterine fibroids on pelvic ultrasound done during hospitalization -menses regular, lasting 3-5 days.  Heavy 1st day then tapers off.  Had a few small clots this mth.  Light cramps with menses. -She feels this is not a major issue for her  HTN: compliant with Losartan. Limits salt in foods.  Tobacco: quit 2 yrs ago but slipped up several wks ago and smoked one cigarette socially     Patient Active Problem List   Diagnosis Date Noted  . Pelvic pain   . Essential hypertension 01/06/2017  . Diverticulitis of large intestine with abscess 01/06/2017  . Diverticulitis of large intestine with abscess without bleeding 05/05/2016     Current Outpatient Prescriptions on File Prior to Visit  Medication Sig Dispense Refill  . LINZESS 145 MCG CAPS capsule Take 145 mcg by mouth daily.  0  . losartan (COZAAR) 25 MG tablet Take 25 mg by mouth daily.  0  . metroNIDAZOLE (FLAGYL) 500 MG tablet Take 1 tablet (500 mg total) by mouth 3 (three) times daily. 30 tablet 0  . ondansetron (ZOFRAN) 4 MG tablet Take 1 tablet (4 mg total) by mouth daily as needed for nausea or vomiting. 20 tablet 0  . polyethylene glycol (MIRALAX / GLYCOLAX) packet Take 17 g by mouth daily as needed for mild constipation.    . sulfamethoxazole-trimethoprim (BACTRIM DS,SEPTRA DS) 800-160 MG tablet Take 1 tablet by mouth 2 (two) times daily. 20 tablet 0  . vitamin C (ASCORBIC ACID) 500 MG tablet Take 500 mg by mouth  daily.    . lactobacillus acidophilus & bulgar (LACTINEX) chewable tablet Chew 1 tablet by mouth 3 (three) times daily with meals. (Patient not taking: Reported on 01/18/2017) 10 tablet 0   No current facility-administered medications on file prior to visit.     Allergies    Allergen Reactions  . Lisinopril Nausea Only    Feels sick  . Shellfish Allergy Anaphylaxis  . Ciprofloxacin Swelling  . Penicillins Other (See Comments)    unknown    Social History   Social History  . Marital status: Divorced    Spouse name: N/A  . Number of children: N/A  . Years of education: N/A   Occupational History  . Not on file.   Social History Main Topics  . Smoking status: Former Smoker    Types: Cigarettes  . Smokeless tobacco: Former Systems developer  . Alcohol use Yes     Comment: occ  . Drug use: Yes    Types: Marijuana  . Sexual activity: Not Currently    Partners: Male   Other Topics Concern  . Not on file   Social History Narrative  . No narrative on file    No family history on file.  Past Surgical History:  Procedure Laterality Date  . IR GENERIC HISTORICAL  05/11/2016   IR CM INJ ANY COLONIC TUBE W/FLUORO 05/11/2016 [facility]INTER[facility]  . TUBAL LIGATION      ROS: Review of Systems  Constitutional: Negative for appetite change and fever.  Respiratory: Negative for shortness of breath.   Cardiovascular: Negative for chest pain and leg swelling.  Gastrointestinal: Negative for abdominal pain and blood in stool.  Genitourinary: Negative for dysuria.    PHYSICAL EXAM: BP 118/81 (BP Location: Left Arm, Patient Position: Sitting, Cuff Size: Large)   Pulse 66   Temp 98.1 F (36.7 C) (Oral)   Resp 16   Ht 5\' 5"  (1.651 m)   Wt 193 lb 9.6 oz (87.8 kg)   LMP 01/15/2017 (Exact Date)   SpO2 100%   BMI 32.22 kg/m   Physical Exam General appearance - alert, well appearing, middle age aaf  and in no distress Mental status - normal mood, behavior, speech, dress, motor activity, and thought processes Neck - supple, no significant adenopathy Chest - clear to auscultation, no wheezes, rales or rhonchi, symmetric air entry Heart - normal rate, regular rhythm, normal S1, S2, no murmurs, rubs, clicks or gallops Abdomen - soft, nontender, nondistended, no masses  or organomegaly Extremities - peripheral pulses normal, no pedal edema, no clubbing or cyanosis  GAD 7 : Generalized Anxiety Score 01/18/2017  Nervous, Anxious, on Edge 0  Control/stop worrying 0  Worry too much - different things 0  Trouble relaxing 0  Restless 0  Easily annoyed or irritable 0  Afraid - awful might happen 0  Total GAD 7 Score 0  Anxiety Difficulty Not difficult at all    Depression screen [facility] 2/9 01/18/2017  Decreased Interest 0  Down, Depressed, Hopeless 0  PHQ - 2 Score 0  Altered sleeping 0  Tired, decreased energy 0  Change in appetite 0  Feeling bad or failure about yourself  0  Trouble concentrating 0  Moving slowly or fidgety/restless 0  Suicidal thoughts 0  PHQ-9 Score 0   Lab Results  Component Value Date   WBC 9.7 01/09/2017   HGB 12.2 01/09/2017   HCT 36.5 01/09/2017   MCV 96.1 01/09/2017   PLT 244 01/09/2017     Chemistry  Component Value Date/Time   NA 139 01/07/2017 0343   K 3.8 01/07/2017 0343   CL 105 01/07/2017 0343   CO2 24 01/07/2017 0343   BUN 5 (L) 01/07/2017 0343   CREATININE 0.83 01/07/2017 0343      Component Value Date/Time   CALCIUM 8.6 (L) 01/07/2017 0343   ALKPHOS 65 01/07/2017 0343   AST 13 (L) 01/07/2017 0343   ALT 10 (L) 01/07/2017 0343   BILITOT 0.5 01/07/2017 0343       ASSESSMENT AND PLAN: 1. Diverticulitis of large intestine with abscess without bleeding -advise healthy eating habits, regular exercise and complete smoking cessation -keep appt with surgeon next mth  2. Essential hypertension -at goal. Continue Losartan DASH  3. Uterine leiomyoma, unspecified location -not causing any significant anemia, heavy, painful or irregular menses so we decided to monitor  4. Former smoker -advised to quit completely.  Discussed health risks associated with smoking. So has done well and regrets the slip up a few wks ago She plans to remain tobacco free -less than 5 mins spent on smoking cessation  counseling  5. Breast cancer screening - MM Digital Screening; Future  6. Colon cancer screening - Ambulatory referral to Gastroenterology  Patient was given the opportun ity to ask questions.  Patient verbalized understanding of the plan and was able to repeat key elements of the plan.    Requested Prescriptions    No prescriptions requested or ordered in this encounter    Return in about 6 weeks (around 03/01/2017) for pap.  Karle Plumber, MD, FACP

## 2017-01-30 ENCOUNTER — Other Ambulatory Visit: Payer: Self-pay | Admitting: Gastroenterology

## 2017-01-30 DIAGNOSIS — K5720 Diverticulitis of large intestine with perforation and abscess without bleeding: Secondary | ICD-10-CM

## 2017-01-30 DIAGNOSIS — Z8719 Personal history of other diseases of the digestive system: Secondary | ICD-10-CM | POA: Diagnosis not present

## 2017-01-30 DIAGNOSIS — K5909 Other constipation: Secondary | ICD-10-CM | POA: Diagnosis not present

## 2017-02-02 ENCOUNTER — Ambulatory Visit: Payer: Medicare Other

## 2017-02-02 ENCOUNTER — Ambulatory Visit
Admission: RE | Admit: 2017-02-02 | Discharge: 2017-02-02 | Disposition: A | Discharge status: Home / Self Care | Payer: Medicare Other | Source: Ambulatory Visit | Attending: Internal Medicine | Admitting: Internal Medicine

## 2017-02-02 ENCOUNTER — Ambulatory Visit
Admission: RE | Admit: 2017-02-02 | Discharge: 2017-02-02 | Disposition: A | Discharge status: Home / Self Care | Payer: Medicare Other | Source: Ambulatory Visit | Attending: Gastroenterology | Admitting: Gastroenterology

## 2017-02-02 DIAGNOSIS — K5720 Diverticulitis of large intestine with perforation and abscess without bleeding: Secondary | ICD-10-CM

## 2017-02-02 DIAGNOSIS — K5792 Diverticulitis of intestine, part unspecified, without perforation or abscess without bleeding: Secondary | ICD-10-CM | POA: Diagnosis not present

## 2017-02-02 DIAGNOSIS — Z1239 Encounter for other screening for malignant neoplasm of breast: Secondary | ICD-10-CM

## 2017-02-02 DIAGNOSIS — Z1231 Encounter for screening mammogram for malignant neoplasm of breast: Secondary | ICD-10-CM | POA: Diagnosis not present

## 2017-02-02 MED ORDER — IOPAMIDOL (ISOVUE-300) INJECTION 61%
100.0000 mL | Freq: Once | INTRAVENOUS | Status: AC | PRN
  Administered 2017-02-02: 100 mL via INTRAVENOUS

## 2017-02-07 DIAGNOSIS — K5720 Diverticulitis of large intestine with perforation and abscess without bleeding: Secondary | ICD-10-CM | POA: Diagnosis not present

## 2017-03-01 ENCOUNTER — Ambulatory Visit: Payer: Medicare Other | Attending: Internal Medicine | Admitting: Internal Medicine

## 2017-03-01 ENCOUNTER — Encounter: Payer: Self-pay | Admitting: Internal Medicine

## 2017-03-01 ENCOUNTER — Other Ambulatory Visit (HOSPITAL_COMMUNITY)
Admission: RE | Admit: 2017-03-01 | Discharge: 2017-03-01 | Disposition: A | Discharge status: Home / Self Care | Payer: Medicare Other | Source: Ambulatory Visit | Attending: Certified Nurse Midwife | Admitting: Certified Nurse Midwife

## 2017-03-01 VITALS — BP 124/90 | HR 76 | Temp 97.9°F | Resp 16 | Wt 199.2 lb

## 2017-03-01 DIAGNOSIS — I10 Essential (primary) hypertension: Secondary | ICD-10-CM | POA: Insufficient documentation

## 2017-03-01 DIAGNOSIS — Z880 Allergy status to penicillin: Secondary | ICD-10-CM | POA: Diagnosis not present

## 2017-03-01 DIAGNOSIS — Z01419 Encounter for gynecological examination (general) (routine) without abnormal findings: Secondary | ICD-10-CM | POA: Diagnosis not present

## 2017-03-01 DIAGNOSIS — Z124 Encounter for screening for malignant neoplasm of cervix: Secondary | ICD-10-CM

## 2017-03-01 DIAGNOSIS — Z79899 Other long term (current) drug therapy: Secondary | ICD-10-CM | POA: Insufficient documentation

## 2017-03-01 DIAGNOSIS — M545 Low back pain, unspecified: Secondary | ICD-10-CM

## 2017-03-01 DIAGNOSIS — Z87891 Personal history of nicotine dependence: Secondary | ICD-10-CM | POA: Diagnosis not present

## 2017-03-01 DIAGNOSIS — R1032 Left lower quadrant pain: Secondary | ICD-10-CM | POA: Diagnosis not present

## 2017-03-01 DIAGNOSIS — Z91013 Allergy to seafood: Secondary | ICD-10-CM | POA: Diagnosis not present

## 2017-03-01 LAB — POCT URINALYSIS DIPSTICK
BILIRUBIN UA: NEGATIVE
Blood, UA: NEGATIVE
Glucose, UA: NEGATIVE
Ketones, UA: NEGATIVE
LEUKOCYTES UA: NEGATIVE
NITRITE UA: NEGATIVE
PH UA: 5.5 (ref 5.0–8.0)
PROTEIN UA: NEGATIVE
Spec Grav, UA: 1.015 (ref 1.010–1.025)
UROBILINOGEN UA: 0.2 U/dL

## 2017-03-01 MED ORDER — SULFAMETHOXAZOLE-TRIMETHOPRIM 800-160 MG PO TABS: 1.0000 | ORAL_TABLET | Freq: Two times a day (BID) | ORAL | 0 refills | Status: DC

## 2017-03-01 MED ORDER — METRONIDAZOLE 500 MG PO TABS
500.0000 mg | ORAL_TABLET | Freq: Two times a day (BID) | ORAL | 0 refills | Status: DC
Start: 2017-03-01 — End: 2017-05-25

## 2017-03-01 NOTE — Progress Notes (Signed)
Patient ID: Jasmine Cobb, female    DOB: 24-May-1966  MRN: [id number]  CC:  PAP  Subjective:  Jasmine Cobb is a 51 y.o. female who presents for UC visit. Her concerns today include:  Hx of HTN, former tobacco, fibroid, diverticulitis.  Last seen by me 01/18/2017 post hospital for diverticulitis. She presents today for Pap.  1. Seen by [facility] since last visit. -Found to have continued imaging findings consistent with acute diverticulitis so she was placed on 2 weeks course of antibiotics. -completed 2 wks of Flagyl and Bactrim 2 days ago.  Has f/u appt 03/05/2017 -started having pain in lower back and slight LT lower quad tenderness in the past 24 hours. She feels symptoms are similar to what she would experience in the past with flareups of diverticulitis before it becomes severe. No fever or abdominal pain with meals. Some nausea.  -no dysuria  2. PAP: -abn PAP in past req. Bx. Since then paps have been normal.  -no vag dischg or itching -still gets menses every mth. Last 02/11/2017 -last sexual active was 1-2 mths ago.  1 female partner -had tubal ligation in 1990  Patient Active Problem List   Diagnosis Date Noted  . Uterine leiomyoma 01/18/2017  . Former smoker 01/18/2017  . Pelvic pain   . Essential hypertension 01/06/2017  . Diverticulitis of large intestine with abscess 01/06/2017  . Diverticulitis of large intestine with abscess without bleeding 05/05/2016     Current Outpatient Prescriptions on File Prior to Visit  Medication Sig Dispense Refill  . lactobacillus acidophilus & bulgar (LACTINEX) chewable tablet Chew 1 tablet by mouth 3 (three) times daily with meals. (Patient not taking: Reported on 01/18/2017) 10 tablet 0  . LINZESS 145 MCG CAPS capsule Take 145 mcg by mouth daily.  0  . losartan (COZAAR) 25 MG tablet Take 25 mg by mouth daily.  0  . ondansetron (ZOFRAN) 4 MG tablet Take 1 tablet (4 mg total) by mouth daily as needed for nausea or vomiting. 20 tablet 0   . polyethylene glycol (MIRALAX / GLYCOLAX) packet Take 17 g by mouth daily as needed for mild constipation.    . vitamin C (ASCORBIC ACID) 500 MG tablet Take 500 mg by mouth daily.     No current facility-administered medications on file prior to visit.     Allergies  Allergen Reactions  . Lisinopril Nausea Only    Feels sick  . Shellfish Allergy Anaphylaxis  . Ciprofloxacin Swelling  . Penicillins Other (See Comments)    unknown     ROS: Review of Systems As stated above  PHYSICAL EXAM: BP 124/90   Pulse 76   Temp 97.9 F (36.6 C) (Oral)   Resp 16   Wt 199 lb 3.2 oz (90.4 kg)   SpO2 97%   BMI 33.15 kg/m   Physical Exam General appearance - alert, well appearing, and in no distress Mental status - alert, oriented to person, place, and time, normal mood, behavior, speech, dress, motor activity, and thought processes Abdomen -normal bowel sounds. Very slight left lower quadrant tenderness without guarding or rebound  Pelvic -CMA [place] present:  normal external genitalia, vulva, vagina, cervix, uterus and adnexa Musculoskeletal - mild tenderness on palpation of RT lumbar paraspinal muscle  POC UA - negative  ASSESSMENT AND PLAN: 1. Pap smear for cervical cancer screening - Cytology - PAP  2. Left lower quadrant pain -Possible mild flare of diverticulitis again though she has just completed antibiotics 2 days  ago. -I will put her back on Flagyl and Bactrim for 7 days until she sees the gastroenterologist on the ninth of next month -Advised for liquid diet over the next 24-48 hours and advance as tolerated. -Precautions given to be seen in the ER if symptoms worsen - CBC With Differential - metroNIDAZOLE (FLAGYL) 500 MG tablet; Take 1 tablet (500 mg total) by mouth 2 (two) times daily.  Dispense: 14 tablet; Refill: 0 - sulfamethoxazole-trimethoprim (BACTRIM DS,SEPTRA DS) 800-160 MG tablet; Take 1 tablet by mouth 2 (two) times daily.  Dispense: 14 tablet; Refill: 0  3.  Acute right-sided low back pain without sciatica - CBC With Differential  Patient was given the opportunity to ask questions.  Patient verbalized understanding of the plan and was able to repeat key elements of the plan.   Orders Placed This Encounter  Procedures  . CBC With Differential     Requested Prescriptions   Signed Prescriptions Disp Refills  . metroNIDAZOLE (FLAGYL) 500 MG tablet 14 tablet 0    Sig: Take 1 tablet (500 mg total) by mouth 2 (two) times daily.  Marland Kitchen sulfamethoxazole-trimethoprim (BACTRIM DS,SEPTRA DS) 800-160 MG tablet 14 tablet 0    Sig: Take 1 tablet by mouth 2 (two) times daily.    Future Appointments Date Time Provider [place]  06/07/2017 11:15 AM Ladell Pier, MD [facility] None    Karle Plumber, MD, [facility]

## 2017-03-01 NOTE — Addendum Note (Signed)
Addended by: Boykin Reaper R on: 03/01/2017 01:50 PM   Modules accepted: Orders

## 2017-03-01 NOTE — Patient Instructions (Signed)
I have sent prescriptions for Bactrim and Flagyl to your pharmacy.   Keep appointment with gastroenterologist.

## 2017-03-02 LAB — CBC WITH DIFFERENTIAL
BASOS ABS: 0 10*3/uL (ref 0.0–0.2)
Basos: 0 %
EOS (ABSOLUTE): 0.2 10*3/uL (ref 0.0–0.4)
Eos: 2 %
Hematocrit: 38.7 % (ref 34.0–46.6)
Hemoglobin: 12.8 g/dL (ref 11.1–15.9)
IMMATURE GRANULOCYTES: 0 %
Immature Grans (Abs): 0 10*3/uL (ref 0.0–0.1)
LYMPHS ABS: 3.3 10*3/uL — AB (ref 0.7–3.1)
Lymphs: 30 %
MCH: 31.9 pg (ref 26.6–33.0)
MCHC: 33.1 g/dL (ref 31.5–35.7)
MCV: 97 fL (ref 79–97)
MONOCYTES: 4 %
Monocytes Absolute: 0.4 10*3/uL (ref 0.1–0.9)
NEUTROS ABS: 7.1 10*3/uL — AB (ref 1.4–7.0)
Neutrophils: 64 %
RBC: 4.01 x10E6/uL (ref 3.77–5.28)
RDW: 13.5 % (ref 12.3–15.4)
WBC: 11 10*3/uL — ABNORMAL HIGH (ref 3.4–10.8)

## 2017-03-05 DIAGNOSIS — K5909 Other constipation: Secondary | ICD-10-CM | POA: Diagnosis not present

## 2017-03-05 DIAGNOSIS — K5720 Diverticulitis of large intestine with perforation and abscess without bleeding: Secondary | ICD-10-CM | POA: Diagnosis not present

## 2017-03-05 DIAGNOSIS — Z8719 Personal history of other diseases of the digestive system: Secondary | ICD-10-CM | POA: Diagnosis not present

## 2017-03-05 LAB — CYTOLOGY - PAP
BACTERIAL VAGINITIS: NEGATIVE
Candida vaginitis: NEGATIVE
Chlamydia: NEGATIVE
DIAGNOSIS: NEGATIVE
HPV: NOT DETECTED
Neisseria Gonorrhea: NEGATIVE
Trichomonas: NEGATIVE

## 2017-03-21 DIAGNOSIS — K5720 Diverticulitis of large intestine with perforation and abscess without bleeding: Secondary | ICD-10-CM | POA: Diagnosis not present

## 2017-04-05 DIAGNOSIS — Z8719 Personal history of other diseases of the digestive system: Secondary | ICD-10-CM | POA: Diagnosis not present

## 2017-04-05 DIAGNOSIS — K5720 Diverticulitis of large intestine with perforation and abscess without bleeding: Secondary | ICD-10-CM | POA: Diagnosis not present

## 2017-04-05 DIAGNOSIS — K5909 Other constipation: Secondary | ICD-10-CM | POA: Diagnosis not present

## 2017-04-19 DIAGNOSIS — K5732 Diverticulitis of large intestine without perforation or abscess without bleeding: Secondary | ICD-10-CM | POA: Diagnosis not present

## 2017-04-19 DIAGNOSIS — K56699 Other intestinal obstruction unspecified as to partial versus complete obstruction: Secondary | ICD-10-CM | POA: Diagnosis not present

## 2017-04-19 DIAGNOSIS — K5730 Diverticulosis of large intestine without perforation or abscess without bleeding: Secondary | ICD-10-CM | POA: Diagnosis not present

## 2017-04-19 DIAGNOSIS — K5669 Other partial intestinal obstruction: Secondary | ICD-10-CM | POA: Diagnosis not present

## 2017-05-02 ENCOUNTER — Ambulatory Visit: Payer: Self-pay | Admitting: General Surgery

## 2017-05-02 DIAGNOSIS — K5720 Diverticulitis of large intestine with perforation and abscess without bleeding: Secondary | ICD-10-CM | POA: Diagnosis not present

## 2017-05-03 ENCOUNTER — Other Ambulatory Visit: Payer: Self-pay | Admitting: Urology

## 2017-05-29 NOTE — Progress Notes (Signed)
EKG 01-06-17 epic

## 2017-05-29 NOTE — Patient Instructions (Signed)
Jasmine Cobb  05/29/2017   Your procedure is scheduled on: 06-07-17  Report to [facility] Main  Entrance Take [place]  elevators to 3rd floor to  [place] at 7:30AM.   Call this number if you have problems the morning of surgery [phone]   Remember: ONLY 1 PERSON MAY GO WITH YOU TO SHORT STAY TO GET  READY MORNING OF [place].  Do not eat food After Midnight on Tuesday 06-05-17. Drink plenty of clear liquids all day Wednesday 06-06-17 and follow all bowel prep instructions given to you by your surgeon. Nothing by mouth after midnight on Wednesday !!     Take these medicines the morning of surgery with A SIP OF WATER: none                                You may not have any metal on your body including hair pins and              piercings  Do not wear jewelry, make-up, lotions, powders or perfumes, deodorant             Do not wear nail polish.  Do not shave  48 hours prior to surgery.              Do not bring valuables to the hospital. [place].  Contacts, dentures or bridgework may not be worn into surgery.  Leave suitcase in the car. After surgery it may be brought to your room.                 Please read over the following fact sheets you were given: _____________________________________________________________________   CLEAR LIQUID DIET   Foods Allowed                                                                     Foods Excluded  Coffee and tea, regular and decaf                             liquids that you cannot  Plain Jell-O in any flavor                                             see through such as: Fruit ices (not with fruit pulp)                                     milk, soups, orange juice  Iced Popsicles                                    All solid food Carbonated beverages, regular and diet  Cranberry, grape and apple  juices Sports drinks like Gatorade Lightly seasoned clear broth or consume(fat free) Sugar, honey syrup  Sample Menu Breakfast                                Lunch                                     Supper Cranberry juice                    Beef broth                            Chicken broth Jell-O                                     Grape juice                           Apple juice Coffee or tea                        Jell-O                                      Popsicle                                                Coffee or tea                        Coffee or tea  _____________________________________________________________________   COLON BOWEL PREP Please follow the instructions carefully. It is important to clean out your bowels & take the prescribed antibiotic pills to lower your chances of a wound infection or abscess.   FIVE DAYS PRIOR TO YOUR SURGERY Stop eating any nuts, popcorn, or fruit with seeds. Stop all fiber supplements such as Metamucil, Citrucel, etc.   Hold taking any blood thinning anticoagulation medication (ex: aspirin, warfarin/Coumadin, Plavix, Xarelto, Eliquis, Pradaxa, etc) as recommended by your medical/cardiology doctor  Obtain what you need at a pharmacy of your choice: -Filled out prescriptions for your oral antibiotics (Neomycin & Metronidazole)  -A bottle of MiraLax / Glycolax (288g) - no prescription required  -A large bottle of Gatorade / Powerade (64oz)  -Dulcolax tablets (4 tabs) - no prescription required   DAY PRIOR TO SURGERY   7:00am Swallow 4 Dulcolax tablets with some water Drink plenty of clear liquids all day to avoid getting dehydrated (Water, juice, soda, coffee, tea, bouillon, jello, etc.)  10:00am Mix the bottle of MiraLax with the 64-oz bottle of Gatorade.  Drink the Gatorade mixture gradually over the next few hours (8oz glass every 15-30 minutes) until gone. You should finish by 2pm.  2:00pm Take 2 Neomycin 500mg  tablets &  2 Metronidazole 500mg  tablets  3:00pm Take 2 Neomycin 500mg  tablets & 2 Metronidazole 500mg  tablets  Drink plenty of clear liquids all evening to avoid getting dehydrated  10:00pm Take  2 Neomycin 500mg  tablets & 2 Metronidazole 500mg  tablets  Do not eat or drink anything after bedtime (midnight) the night before your surgery.   MORNING OF SURGERY Remember to not to drink or eat anything that morning  Hold or take medications as recommended by the hospital staff at your Preoperative visit  If you have questions or concerns, please call [employer] [phone] during business hours to speak to the clinical staff for advice.   [facility] - Preparing for Surgery Before surgery, you can play an important role.  Because skin is not sterile, your skin needs to be as free of germs as possible.  You can reduce the number of germs on your skin by washing with CHG (chlorahexidine gluconate) soap before surgery.  CHG is an antiseptic cleaner which kills germs and bonds with the skin to continue killing germs even after washing. Please DO NOT use if you have an allergy to CHG or antibacterial soaps.  If your skin becomes reddened/irritated stop using the CHG and inform your nurse when you arrive at Short Stay. Do not shave (including legs and underarms) for at least 48 hours prior to the first CHG shower.  You may shave your face/neck. Please follow these instructions carefully:  1.  Shower with CHG Soap the night before surgery and the  morning of Surgery.  2.  If you choose to wash your hair, wash your hair first as usual with your  normal  shampoo.  3.  After you shampoo, rinse your hair and body thoroughly to remove the  shampoo.                           4.  Use CHG as you would any other liquid soap.  You can apply chg directly  to the skin and wash                       Gently with a scrungie or clean washcloth.  5.  Apply the CHG Soap to your body ONLY FROM THE NECK DOWN.   Do  not use on face/ open                           Wound or open sores. Avoid contact with eyes, ears mouth and genitals (private parts).                       Wash face,  Genitals (private parts) with your normal soap.             6.  Wash thoroughly, paying special attention to the area where your surgery  will be performed.  7.  Thoroughly rinse your body with warm water from the neck down.  8.  DO NOT shower/wash with your normal soap after using and rinsing off  the CHG Soap.                9.  Pat yourself dry with a clean towel.            10.  Wear clean pajamas.            11.  Place clean sheets on your bed the night of your first shower and do not  sleep with pets. Day of Surgery : Do not apply any lotions/deodorants the morning of surgery.  Please wear clean clothes to the  hospital/surgery center.  FAILURE TO FOLLOW THESE INSTRUCTIONS MAY RESULT IN THE CANCELLATION OF YOUR SURGERY PATIENT SIGNATURE_________________________________  NURSE SIGNATURE__________________________________  ________________________________________________________________________   Jasmine Cobb  An incentive spirometer is a tool that can help keep your lungs clear and active. This tool measures how well you are filling your lungs with each breath. Taking long deep breaths may help reverse or decrease the chance of developing breathing (pulmonary) problems (especially infection) following:  A long period of time when you are unable to move or be active. BEFORE THE PROCEDURE   If the spirometer includes an indicator to show your best effort, your nurse or respiratory therapist will set it to a desired goal.  If possible, sit up straight or lean slightly forward. Try not to slouch.  Hold the incentive spirometer in an upright position. INSTRUCTIONS FOR USE  1. Sit on the edge of your bed if possible, or sit up as far as you can in bed or on a chair. 2. Hold the incentive spirometer in an upright  position. 3. Breathe out normally. 4. Place the mouthpiece in your mouth and seal your lips tightly around it. 5. Breathe in slowly and as deeply as possible, raising the piston or the ball toward the top of the column. 6. Hold your breath for 3-5 seconds or for as long as possible. Allow the piston or ball to fall to the bottom of the column. 7. Remove the mouthpiece from your mouth and breathe out normally. 8. Rest for a few seconds and repeat Steps 1 through 7 at least 10 times every 1-2 hours when you are awake. Take your time and take a few normal breaths between deep breaths. 9. The spirometer may include an indicator to show your best effort. Use the indicator as a goal to work toward during each repetition. 10. After each set of 10 deep breaths, practice coughing to be sure your lungs are clear. If you have an incision (the cut made at the time of surgery), support your incision when coughing by placing a pillow or rolled up towels firmly against it. Once you are able to get out of bed, walk around indoors and cough well. You may stop using the incentive spirometer when instructed by your caregiver.  RISKS AND COMPLICATIONS  Take your time so you do not get dizzy or light-headed.  If you are in pain, you may need to take or ask for pain medication before doing incentive spirometry. It is harder to take a deep breath if you are having pain. AFTER USE  Rest and breathe slowly and easily.  It can be helpful to keep track of a log of your progress. Your caregiver can provide you with a simple table to help with this. If you are using the spirometer at home, follow these instructions: [place] IF:   You are having difficultly using the spirometer.  You have trouble using the spirometer as often as instructed.  Your pain medication is not giving enough relief while using the spirometer.  You develop fever of 100.5 F (38.1 C) or higher. SEEK IMMEDIATE MEDICAL CARE IF:    You cough up bloody sputum that had not been present before.  You develop fever of 102 F (38.9 C) or greater.  You develop worsening pain at or near the incision site. MAKE SURE YOU:   Understand these instructions.  Will watch your condition.  Will get help right away if you are not doing well or get worse.  Document Released: 12/25/2006 Document Revised: 11/06/2011 Document Reviewed: 02/25/2007 [facility] Patient Information 2014 [place], [place].   ________________________________________________________________________

## 2017-05-30 ENCOUNTER — Encounter (HOSPITAL_COMMUNITY)
Admission: RE | Admit: 2017-05-30 | Discharge: 2017-05-30 | Disposition: A | Discharge status: Home / Self Care | Payer: Medicare Other | Source: Ambulatory Visit | Attending: General Surgery | Admitting: General Surgery

## 2017-05-30 ENCOUNTER — Encounter (HOSPITAL_COMMUNITY): Payer: Self-pay

## 2017-05-30 DIAGNOSIS — Z01812 Encounter for preprocedural laboratory examination: Secondary | ICD-10-CM | POA: Insufficient documentation

## 2017-05-30 HISTORY — DX: Anxiety disorder, unspecified: F41.9

## 2017-05-30 HISTORY — DX: Migraine, unspecified, not intractable, without status migrainosus: G43.909

## 2017-05-30 LAB — CBC WITH DIFFERENTIAL/PLATELET
BASOS PCT: 0 %
Basophils Absolute: 0 10*3/uL (ref 0.0–0.1)
Eosinophils Absolute: 0.2 10*3/uL (ref 0.0–0.7)
Eosinophils Relative: 1 %
HEMATOCRIT: 40.8 % (ref 36.0–46.0)
HEMOGLOBIN: 14.1 g/dL (ref 12.0–15.0)
LYMPHS ABS: 2.9 10*3/uL (ref 0.7–4.0)
Lymphocytes Relative: 25 %
MCH: 32.5 pg (ref 26.0–34.0)
MCHC: 34.6 g/dL (ref 30.0–36.0)
MCV: 94 fL (ref 78.0–100.0)
MONOS PCT: 7 %
Monocytes Absolute: 0.8 10*3/uL (ref 0.1–1.0)
NEUTROS ABS: 7.9 10*3/uL — AB (ref 1.7–7.7)
NEUTROS PCT: 67 %
Platelets: 288 10*3/uL (ref 150–400)
RBC: 4.34 MIL/uL (ref 3.87–5.11)
RDW: 12.5 % (ref 11.5–15.5)
WBC: 11.8 10*3/uL — AB (ref 4.0–10.5)

## 2017-05-30 LAB — BASIC METABOLIC PANEL
ANION GAP: 9 (ref 5–15)
BUN: 12 mg/dL (ref 6–20)
CHLORIDE: 105 mmol/L (ref 101–111)
CO2: 23 mmol/L (ref 22–32)
Calcium: 8.9 mg/dL (ref 8.9–10.3)
Creatinine, Ser: 0.89 mg/dL (ref 0.44–1.00)
GFR calc non Af Amer: >60 mL/min (ref >60)
Glucose, Bld: 92 mg/dL (ref 65–99)
Potassium: 4.2 mmol/L (ref 3.5–5.1)
SODIUM: 137 mmol/L (ref 135–145)

## 2017-05-30 LAB — HEMOGLOBIN A1C
HEMOGLOBIN A1C: 5.5 % (ref 4.8–5.6)
MEAN PLASMA GLUCOSE: 111.15 mg/dL

## 2017-05-30 LAB — ABO/RH: ABO/RH(D): A POS

## 2017-05-30 NOTE — Consult Note (Signed)
[facility] Nurse ostomy consult note [facility] Nurse requested for preoperative stoma site marking by Dr. Kieth Brightly.  DOS is 06/07/17.  Discussed surgical procedure and stoma creation with patient.  Explained role of the [facility] nurse team.  Patient declined educational booklet, but is happy to learn if she has an ostomy created intraoperatively. Answered patient questions.   Examined patient sitting and standing in order to place the marking in the patient's visual field, away from any creases or abdominal contour issues and within the rectus muscle.  Attempted to mark below the patient's belt line, but the patient has a soft, obese abdomen with a deep crease at the umbilicus.  Patient would not be able to visualize stoma in the lower quadrants.   Marked for colostomy in the LUQ  5cm to the left of the umbilicus and 0.5XG above the umbilicus.  Patient's abdomen cleansed with CHG wipes at site markings, allowed to air dry prior to marking.Covered mark with thin film transparent dressing to preserve mark until date of surgery.   [place] nursing team will not follow, but will remain available to this patient, the nursing, surgical and medical teams.  Please re-consult if an ostomy is created intraoperatively. Thanks, Maudie Flakes, MSN, RN, [place], [facility]  Pager# [phone]

## 2017-06-06 MED ORDER — CLINDAMYCIN PHOSPHATE 900 MG/50ML IV SOLN
900.0000 mg | INTRAVENOUS | Status: AC
  Administered 2017-06-07: 900 mg via INTRAVENOUS
  Filled 2017-06-06: qty 50

## 2017-06-06 MED ORDER — GENTAMICIN SULFATE 40 MG/ML IJ SOLN
5.0000 mg/kg | INTRAVENOUS | Status: AC
  Administered 2017-06-07: 360 mg via INTRAVENOUS
  Filled 2017-06-06: qty 9

## 2017-06-06 MED ORDER — SODIUM CHLORIDE 0.9 % IV SOLN
INTRAVENOUS | Status: DC
  Filled 2017-06-06: qty 6

## 2017-06-07 ENCOUNTER — Ambulatory Visit: Payer: Medicare Other | Admitting: Internal Medicine

## 2017-06-07 ENCOUNTER — Inpatient Hospital Stay (HOSPITAL_COMMUNITY)
Admission: RE | Admit: 2017-06-07 | Discharge: 2017-06-09 | DRG: 331 | Disposition: A | Discharge status: Home / Self Care | Payer: Medicare Other | Source: Ambulatory Visit | Attending: General Surgery | Admitting: General Surgery

## 2017-06-07 ENCOUNTER — Encounter (HOSPITAL_COMMUNITY)
Admission: RE | Disposition: A | Discharge status: Home / Self Care | Payer: Self-pay | Source: Ambulatory Visit | Attending: General Surgery

## 2017-06-07 ENCOUNTER — Inpatient Hospital Stay (HOSPITAL_COMMUNITY): Payer: Medicare Other | Admitting: Certified Registered Nurse Anesthetist

## 2017-06-07 ENCOUNTER — Inpatient Hospital Stay (HOSPITAL_COMMUNITY): Payer: Medicare Other

## 2017-06-07 ENCOUNTER — Encounter (HOSPITAL_COMMUNITY): Payer: Self-pay

## 2017-06-07 DIAGNOSIS — Z880 Allergy status to penicillin: Secondary | ICD-10-CM | POA: Diagnosis not present

## 2017-06-07 DIAGNOSIS — K5720 Diverticulitis of large intestine with perforation and abscess without bleeding: Secondary | ICD-10-CM | POA: Diagnosis not present

## 2017-06-07 DIAGNOSIS — I10 Essential (primary) hypertension: Secondary | ICD-10-CM | POA: Diagnosis present

## 2017-06-07 DIAGNOSIS — Z803 Family history of malignant neoplasm of breast: Secondary | ICD-10-CM | POA: Diagnosis not present

## 2017-06-07 DIAGNOSIS — Z23 Encounter for immunization: Secondary | ICD-10-CM

## 2017-06-07 DIAGNOSIS — K5732 Diverticulitis of large intestine without perforation or abscess without bleeding: Principal | ICD-10-CM | POA: Diagnosis present

## 2017-06-07 DIAGNOSIS — K660 Peritoneal adhesions (postprocedural) (postinfection): Secondary | ICD-10-CM | POA: Diagnosis present

## 2017-06-07 DIAGNOSIS — D259 Leiomyoma of uterus, unspecified: Secondary | ICD-10-CM | POA: Diagnosis not present

## 2017-06-07 DIAGNOSIS — Z91013 Allergy to seafood: Secondary | ICD-10-CM

## 2017-06-07 DIAGNOSIS — Z87891 Personal history of nicotine dependence: Secondary | ICD-10-CM

## 2017-06-07 DIAGNOSIS — Z408 Encounter for other prophylactic surgery: Secondary | ICD-10-CM | POA: Diagnosis not present

## 2017-06-07 DIAGNOSIS — Z881 Allergy status to other antibiotic agents status: Secondary | ICD-10-CM | POA: Diagnosis not present

## 2017-06-07 HISTORY — PX: CYSTOSCOPY WITH STENT PLACEMENT: SHX5790

## 2017-06-07 HISTORY — PX: PROCTOSCOPY: SHX2266

## 2017-06-07 HISTORY — PX: COLON RESECTION: SHX5231

## 2017-06-07 LAB — CBC
HCT: 38.4 % (ref 36.0–46.0)
HEMOGLOBIN: 13.5 g/dL (ref 12.0–15.0)
MCH: 32.9 pg (ref 26.0–34.0)
MCHC: 35.2 g/dL (ref 30.0–36.0)
MCV: 93.7 fL (ref 78.0–100.0)
Platelets: 243 10*3/uL (ref 150–400)
RBC: 4.1 MIL/uL (ref 3.87–5.11)
RDW: 12.7 % (ref 11.5–15.5)
WBC: 18.4 10*3/uL — ABNORMAL HIGH (ref 4.0–10.5)

## 2017-06-07 LAB — TYPE AND SCREEN
ABO/RH(D): A POS
Antibody Screen: NEGATIVE

## 2017-06-07 LAB — CREATININE, SERUM
Creatinine, Ser: 0.98 mg/dL (ref 0.44–1.00)
GFR calc Af Amer: >60 mL/min (ref >60)
GFR calc non Af Amer: >60 mL/min (ref >60)

## 2017-06-07 SURGERY — LAPAROSCOPIC SIGMOID COLON RESECTION
Anesthesia: General | Site: Ureter

## 2017-06-07 MED ORDER — SODIUM CHLORIDE 0.9 % IR SOLN
Status: DC | PRN
  Administered 2017-06-07: 3000 mL

## 2017-06-07 MED ORDER — BISACODYL 5 MG PO TBEC
20.0000 mg | DELAYED_RELEASE_TABLET | Freq: Once | ORAL | Status: DC
  Filled 2017-06-07: qty 4

## 2017-06-07 MED ORDER — BUPIVACAINE-EPINEPHRINE (PF) 0.25% -1:200000 IJ SOLN
INTRAMUSCULAR | Status: AC
  Filled 2017-06-07: qty 30

## 2017-06-07 MED ORDER — 0.9 % SODIUM CHLORIDE (POUR BTL) OPTIME
TOPICAL | Status: DC | PRN
  Administered 2017-06-07: 2000 mL

## 2017-06-07 MED ORDER — HEPARIN SODIUM (PORCINE) 5000 UNIT/ML IJ SOLN
5000.0000 [IU] | Freq: Once | INTRAMUSCULAR | Status: AC
  Administered 2017-06-07: 5000 [IU] via SUBCUTANEOUS
  Filled 2017-06-07: qty 1

## 2017-06-07 MED ORDER — BUPIVACAINE-EPINEPHRINE 0.25% -1:200000 IJ SOLN
INTRAMUSCULAR | Status: DC | PRN
  Administered 2017-06-07: 30 mL

## 2017-06-07 MED ORDER — ROCURONIUM BROMIDE 50 MG/5ML IV SOSY
PREFILLED_SYRINGE | INTRAVENOUS | Status: AC
  Filled 2017-06-07: qty 5

## 2017-06-07 MED ORDER — LIDOCAINE 2% (20 MG/ML) 5 ML SYRINGE
INTRAMUSCULAR | Status: AC
  Filled 2017-06-07: qty 5

## 2017-06-07 MED ORDER — HYDROCODONE-ACETAMINOPHEN 5-325 MG PO TABS
1.0000 | ORAL_TABLET | ORAL | Status: DC | PRN
  Administered 2017-06-08: 1 via ORAL
  Filled 2017-06-07 (×2): qty 1

## 2017-06-07 MED ORDER — GABAPENTIN 300 MG PO CAPS
300.0000 mg | ORAL_CAPSULE | Freq: Two times a day (BID) | ORAL | Status: DC
  Administered 2017-06-07 – 2017-06-09 (×4): 300 mg via ORAL
  Filled 2017-06-07 (×4): qty 1

## 2017-06-07 MED ORDER — HYDRALAZINE HCL 20 MG/ML IJ SOLN
INTRAMUSCULAR | Status: DC | PRN
  Administered 2017-06-07: 2.5 mg via INTRAVENOUS
  Administered 2017-06-07: 5 mg via INTRAVENOUS
  Administered 2017-06-07 (×2): 2.5 mg via INTRAVENOUS

## 2017-06-07 MED ORDER — SODIUM CHLORIDE 0.9 % IV SOLN
INTRAVENOUS | Status: DC | PRN
  Administered 2017-06-07: 800 mL

## 2017-06-07 MED ORDER — KETAMINE HCL-SODIUM CHLORIDE 100-0.9 MG/10ML-% IV SOSY
PREFILLED_SYRINGE | INTRAVENOUS | Status: AC
  Filled 2017-06-07: qty 10

## 2017-06-07 MED ORDER — LIDOCAINE HCL (CARDIAC) 20 MG/ML IV SOLN
INTRAVENOUS | Status: DC | PRN
  Administered 2017-06-07: 75 mg via INTRAVENOUS

## 2017-06-07 MED ORDER — ONDANSETRON HCL 4 MG/2ML IJ SOLN
INTRAMUSCULAR | Status: DC | PRN
  Administered 2017-06-07 (×2): 4 mg via INTRAVENOUS

## 2017-06-07 MED ORDER — EPINEPHRINE PF 1 MG/10ML IJ SOSY
PREFILLED_SYRINGE | INTRAMUSCULAR | Status: AC
  Filled 2017-06-07: qty 10

## 2017-06-07 MED ORDER — LIDOCAINE 2% (20 MG/ML) 5 ML SYRINGE
INTRAMUSCULAR | Status: DC | PRN
  Administered 2017-06-07: 1.5 mg/kg/h via INTRAVENOUS

## 2017-06-07 MED ORDER — MEPERIDINE HCL 50 MG/ML IJ SOLN: 6.2500 mg | INTRAMUSCULAR | Status: DC | PRN

## 2017-06-07 MED ORDER — POLYETHYLENE GLYCOL 3350 17 GM/SCOOP PO POWD: 1.0000 | Freq: Once | ORAL | Status: DC

## 2017-06-07 MED ORDER — ONDANSETRON HCL 4 MG/2ML IJ SOLN: 4.0000 mg | Freq: Four times a day (QID) | INTRAMUSCULAR | Status: DC | PRN

## 2017-06-07 MED ORDER — ALVIMOPAN 12 MG PO CAPS
12.0000 mg | ORAL_CAPSULE | ORAL | Status: AC
  Administered 2017-06-07: 12 mg via ORAL
  Filled 2017-06-07: qty 1

## 2017-06-07 MED ORDER — CELECOXIB 200 MG PO CAPS
400.0000 mg | ORAL_CAPSULE | ORAL | Status: AC
  Administered 2017-06-07: 400 mg via ORAL
  Filled 2017-06-07: qty 2

## 2017-06-07 MED ORDER — KETOROLAC TROMETHAMINE 15 MG/ML IJ SOLN
15.0000 mg | Freq: Four times a day (QID) | INTRAMUSCULAR | Status: DC | PRN
  Filled 2017-06-07: qty 1

## 2017-06-07 MED ORDER — MIDAZOLAM HCL 5 MG/5ML IJ SOLN
INTRAMUSCULAR | Status: DC | PRN
  Administered 2017-06-07 (×2): 1 mg via INTRAVENOUS

## 2017-06-07 MED ORDER — ALVIMOPAN 12 MG PO CAPS
12.0000 mg | ORAL_CAPSULE | Freq: Two times a day (BID) | ORAL | Status: DC
  Administered 2017-06-08 (×2): 12 mg via ORAL
  Filled 2017-06-07 (×2): qty 1

## 2017-06-07 MED ORDER — CHLORHEXIDINE GLUCONATE 4 % EX LIQD: 60.0000 mL | Freq: Once | CUTANEOUS | Status: DC

## 2017-06-07 MED ORDER — LABETALOL HCL 5 MG/ML IV SOLN
INTRAVENOUS | Status: AC
  Filled 2017-06-07: qty 4

## 2017-06-07 MED ORDER — METRONIDAZOLE 500 MG PO TABS: 1000.0000 mg | ORAL_TABLET | ORAL | Status: DC

## 2017-06-07 MED ORDER — LACTATED RINGERS IV SOLN
INTRAVENOUS | Status: DC
  Administered 2017-06-07 (×3): via INTRAVENOUS

## 2017-06-07 MED ORDER — FENTANYL CITRATE (PF) 100 MCG/2ML IJ SOLN
INTRAMUSCULAR | Status: DC | PRN
  Administered 2017-06-07: 50 ug via INTRAVENOUS
  Administered 2017-06-07: 100 ug via INTRAVENOUS
  Administered 2017-06-07 (×6): 50 ug via INTRAVENOUS

## 2017-06-07 MED ORDER — PROMETHAZINE HCL 25 MG/ML IJ SOLN
INTRAMUSCULAR | Status: DC
Start: 2017-06-07 — End: 2017-06-07
  Filled 2017-06-07: qty 1

## 2017-06-07 MED ORDER — NEOMYCIN SULFATE 500 MG PO TABS: 1000.0000 mg | ORAL_TABLET | ORAL | Status: DC

## 2017-06-07 MED ORDER — ONDANSETRON HCL 4 MG/2ML IJ SOLN
INTRAMUSCULAR | Status: AC
  Filled 2017-06-07: qty 2

## 2017-06-07 MED ORDER — FENTANYL CITRATE (PF) 100 MCG/2ML IJ SOLN
25.0000 ug | INTRAMUSCULAR | Status: DC | PRN
  Administered 2017-06-07 (×4): 25 ug via INTRAVENOUS

## 2017-06-07 MED ORDER — PHENYLEPHRINE HCL 10 MG/ML IJ SOLN
INTRAMUSCULAR | Status: DC | PRN
  Administered 2017-06-07 (×3): 40 ug via INTRAVENOUS
  Administered 2017-06-07 (×2): 20 ug via INTRAVENOUS
  Administered 2017-06-07: 40 ug via INTRAVENOUS

## 2017-06-07 MED ORDER — FENTANYL CITRATE (PF) 100 MCG/2ML IJ SOLN
INTRAMUSCULAR | Status: AC
Start: 2017-06-07 — End: ?
  Filled 2017-06-07: qty 2

## 2017-06-07 MED ORDER — MIDAZOLAM HCL 2 MG/2ML IJ SOLN
INTRAMUSCULAR | Status: AC
  Filled 2017-06-07: qty 2

## 2017-06-07 MED ORDER — GABAPENTIN 300 MG PO CAPS
300.0000 mg | ORAL_CAPSULE | ORAL | Status: AC
  Administered 2017-06-07: 300 mg via ORAL
  Filled 2017-06-07: qty 1

## 2017-06-07 MED ORDER — LACTATED RINGERS IR SOLN
Status: DC | PRN
  Administered 2017-06-07: 1000 mL

## 2017-06-07 MED ORDER — INFLUENZA VAC SPLIT QUAD 0.5 ML IM SUSY
0.5000 mL | PREFILLED_SYRINGE | INTRAMUSCULAR | Status: AC
  Administered 2017-06-08: 0.5 mL via INTRAMUSCULAR
  Filled 2017-06-07: qty 0.5

## 2017-06-07 MED ORDER — SUGAMMADEX SODIUM 200 MG/2ML IV SOLN
INTRAVENOUS | Status: AC
  Filled 2017-06-07: qty 2

## 2017-06-07 MED ORDER — SODIUM CHLORIDE 0.9 % IV SOLN
INTRAVENOUS | Status: DC
  Administered 2017-06-07 – 2017-06-08 (×3): via INTRAVENOUS

## 2017-06-07 MED ORDER — PROPOFOL 10 MG/ML IV BOLUS
INTRAVENOUS | Status: AC
  Filled 2017-06-07: qty 20

## 2017-06-07 MED ORDER — FENTANYL CITRATE (PF) 100 MCG/2ML IJ SOLN
INTRAMUSCULAR | Status: AC
  Filled 2017-06-07: qty 4

## 2017-06-07 MED ORDER — CLINDAMYCIN PHOSPHATE 900 MG/50ML IV SOLN: 900.0000 mg | INTRAVENOUS | Status: DC

## 2017-06-07 MED ORDER — HYDRALAZINE HCL 20 MG/ML IJ SOLN
INTRAMUSCULAR | Status: AC
  Filled 2017-06-07: qty 1

## 2017-06-07 MED ORDER — LOSARTAN POTASSIUM 25 MG PO TABS
25.0000 mg | ORAL_TABLET | Freq: Every day | ORAL | Status: DC
  Administered 2017-06-07 – 2017-06-09 (×3): 25 mg via ORAL
  Filled 2017-06-07 (×3): qty 1

## 2017-06-07 MED ORDER — KETAMINE HCL 10 MG/ML IJ SOLN
INTRAMUSCULAR | Status: DC | PRN
  Administered 2017-06-07: 40 mg via INTRAVENOUS

## 2017-06-07 MED ORDER — EPHEDRINE SULFATE 50 MG/ML IJ SOLN
INTRAMUSCULAR | Status: DC | PRN
  Administered 2017-06-07: 5 mg via INTRAVENOUS

## 2017-06-07 MED ORDER — FENTANYL CITRATE (PF) 100 MCG/2ML IJ SOLN
INTRAMUSCULAR | Status: AC
  Filled 2017-06-07: qty 2

## 2017-06-07 MED ORDER — DIPHENHYDRAMINE HCL 25 MG PO CAPS: 25.0000 mg | ORAL_CAPSULE | Freq: Four times a day (QID) | ORAL | Status: DC | PRN

## 2017-06-07 MED ORDER — ENOXAPARIN SODIUM 40 MG/0.4ML ~~LOC~~ SOLN
40.0000 mg | SUBCUTANEOUS | Status: DC
  Administered 2017-06-08 – 2017-06-09 (×2): 40 mg via SUBCUTANEOUS
  Filled 2017-06-07 (×2): qty 0.4

## 2017-06-07 MED ORDER — ONDANSETRON 4 MG PO TBDP: 4.0000 mg | ORAL_TABLET | Freq: Four times a day (QID) | ORAL | Status: DC | PRN

## 2017-06-07 MED ORDER — ROCURONIUM BROMIDE 100 MG/10ML IV SOLN
INTRAVENOUS | Status: DC | PRN
  Administered 2017-06-07: 50 mg via INTRAVENOUS
  Administered 2017-06-07: 10 mg via INTRAVENOUS
  Administered 2017-06-07: 5 mg via INTRAVENOUS
  Administered 2017-06-07: 10 mg via INTRAVENOUS

## 2017-06-07 MED ORDER — DIPHENHYDRAMINE HCL 50 MG/ML IJ SOLN: 25.0000 mg | Freq: Four times a day (QID) | INTRAMUSCULAR | Status: DC | PRN

## 2017-06-07 MED ORDER — LIDOCAINE 2% (20 MG/ML) 5 ML SYRINGE
INTRAMUSCULAR | Status: AC
  Filled 2017-06-07: qty 10

## 2017-06-07 MED ORDER — ACETAMINOPHEN 500 MG PO TABS
1000.0000 mg | ORAL_TABLET | ORAL | Status: AC
  Administered 2017-06-07: 1000 mg via ORAL
  Filled 2017-06-07: qty 2

## 2017-06-07 MED ORDER — DEXAMETHASONE SODIUM PHOSPHATE 10 MG/ML IJ SOLN
INTRAMUSCULAR | Status: DC | PRN
  Administered 2017-06-07: 10 mg via INTRAVENOUS

## 2017-06-07 MED ORDER — TRAMADOL HCL 50 MG PO TABS
50.0000 mg | ORAL_TABLET | Freq: Four times a day (QID) | ORAL | Status: DC | PRN
  Administered 2017-06-08 (×2): 50 mg via ORAL
  Filled 2017-06-07 (×2): qty 1

## 2017-06-07 MED ORDER — SUGAMMADEX SODIUM 500 MG/5ML IV SOLN
INTRAVENOUS | Status: DC | PRN
  Administered 2017-06-07: 200 mg via INTRAVENOUS

## 2017-06-07 MED ORDER — ONDANSETRON HCL 4 MG/2ML IJ SOLN: 4.0000 mg | Freq: Once | INTRAMUSCULAR | Status: DC | PRN

## 2017-06-07 MED ORDER — HYDRALAZINE HCL 20 MG/ML IJ SOLN: 10.0000 mg | INTRAMUSCULAR | Status: DC | PRN

## 2017-06-07 MED ORDER — DEXAMETHASONE SODIUM PHOSPHATE 10 MG/ML IJ SOLN
INTRAMUSCULAR | Status: AC
  Filled 2017-06-07: qty 1

## 2017-06-07 MED ORDER — LABETALOL HCL 5 MG/ML IV SOLN
INTRAVENOUS | Status: DC | PRN
  Administered 2017-06-07: 2.5 mg via INTRAVENOUS

## 2017-06-07 MED ORDER — GENTAMICIN SULFATE 40 MG/ML IJ SOLN: 5.0000 mg/kg | INTRAVENOUS | Status: DC

## 2017-06-07 MED ORDER — SIMETHICONE 80 MG PO CHEW: 40.0000 mg | CHEWABLE_TABLET | Freq: Four times a day (QID) | ORAL | Status: DC | PRN

## 2017-06-07 MED ORDER — BUPIVACAINE LIPOSOME 1.3 % IJ SUSP
20.0000 mL | Freq: Once | INTRAMUSCULAR | Status: AC
  Administered 2017-06-07: 20 mL
  Filled 2017-06-07: qty 20

## 2017-06-07 MED ORDER — KETOROLAC TROMETHAMINE 15 MG/ML IJ SOLN
15.0000 mg | Freq: Four times a day (QID) | INTRAMUSCULAR | Status: AC
  Administered 2017-06-07: 15 mg via INTRAVENOUS

## 2017-06-07 MED ORDER — PROPOFOL 10 MG/ML IV BOLUS
INTRAVENOUS | Status: DC | PRN
  Administered 2017-06-07: 50 mg via INTRAVENOUS
  Administered 2017-06-07: 150 mg via INTRAVENOUS

## 2017-06-07 MED ORDER — MORPHINE SULFATE (PF) 2 MG/ML IV SOLN: 2.0000 mg | INTRAVENOUS | Status: DC | PRN

## 2017-06-07 MED ORDER — FENTANYL CITRATE (PF) 250 MCG/5ML IJ SOLN
INTRAMUSCULAR | Status: AC
  Filled 2017-06-07: qty 5

## 2017-06-07 MED ORDER — SODIUM CHLORIDE 0.9 % IJ SOLN
INTRAMUSCULAR | Status: AC
  Filled 2017-06-07: qty 10

## 2017-06-07 SURGICAL SUPPLY — 80 items
ADAPTER GOLDBERG URETERAL (ADAPTER) ×5 IMPLANT
APPLIER CLIP 5 13 M/L LIGAMAX5 (MISCELLANEOUS)
APPLIER CLIP ROT 10 11.4 M/L (STAPLE)
BAG URO CATCHER STRL LF (MISCELLANEOUS) ×5 IMPLANT
BANDAGE ADH SHEER 1  50/CT (GAUZE/BANDAGES/DRESSINGS) ×5 IMPLANT
BENZOIN TINCTURE PRP APPL 2/3 (GAUZE/BANDAGES/DRESSINGS) ×5 IMPLANT
BLADE EXTENDED COATED 6.5IN (ELECTRODE) IMPLANT
CABLE HIGH FREQUENCY MONO STRZ (ELECTRODE) ×5 IMPLANT
CATH URET 5FR 28IN OPEN ENDED (CATHETERS) ×10 IMPLANT
CELLS DAT CNTRL 66122 CELL SVR (MISCELLANEOUS) IMPLANT
CHLORAPREP W/TINT 26ML (MISCELLANEOUS) ×5 IMPLANT
CLIP APPLIE 5 13 M/L LIGAMAX5 (MISCELLANEOUS) IMPLANT
CLIP APPLIE ROT 10 11.4 M/L (STAPLE) IMPLANT
CLOSURE WOUND 1/2 X4 (GAUZE/BANDAGES/DRESSINGS) ×1
CLOTH BEACON ORANGE TIMEOUT ST (SAFETY) ×5 IMPLANT
COUNTER NEEDLE 20 DBL MAG RED (NEEDLE) ×5 IMPLANT
COVER FOOTSWITCH UNIV (MISCELLANEOUS) IMPLANT
COVER MAYO STAND STRL (DRAPES) ×15 IMPLANT
COVER SURGICAL LIGHT HANDLE (MISCELLANEOUS) ×15 IMPLANT
DECANTER SPIKE VIAL GLASS SM (MISCELLANEOUS) ×5 IMPLANT
DRAIN CHANNEL 19F RND (DRAIN) IMPLANT
DRAPE LAPAROSCOPIC ABDOMINAL (DRAPES) IMPLANT
DRAPE UTILITY XL STRL (DRAPES) ×5 IMPLANT
DRSG OPSITE POSTOP 4X10 (GAUZE/BANDAGES/DRESSINGS) IMPLANT
DRSG OPSITE POSTOP 4X6 (GAUZE/BANDAGES/DRESSINGS) ×5 IMPLANT
DRSG OPSITE POSTOP 4X8 (GAUZE/BANDAGES/DRESSINGS) IMPLANT
ELECT PENCIL ROCKER SW 15FT (MISCELLANEOUS) ×10 IMPLANT
ELECT REM PT RETURN 15FT ADLT (MISCELLANEOUS) ×5 IMPLANT
ENDOLOOP SUT PDS II  0 18 (SUTURE)
ENDOLOOP SUT PDS II 0 18 (SUTURE) IMPLANT
GAUZE SPONGE 4X4 12PLY STRL (GAUZE/BANDAGES/DRESSINGS) IMPLANT
GLOVE BIO SURGEON STRL SZ7 (GLOVE) ×10 IMPLANT
GLOVE BIOGEL PI IND STRL 7.0 (GLOVE) ×6 IMPLANT
GLOVE BIOGEL PI INDICATOR 7.0 (GLOVE) ×4
GLOVE SURG SS PI 8.0 STRL IVOR (GLOVE) IMPLANT
GOWN STRL REUS W/TWL LRG LVL3 (GOWN DISPOSABLE) ×10 IMPLANT
GOWN STRL REUS W/TWL XL LVL3 (GOWN DISPOSABLE) ×25 IMPLANT
GUIDEWIRE STR DUAL SENSOR (WIRE) ×5 IMPLANT
IRRIG SUCT STRYKERFLOW 2 WTIP (MISCELLANEOUS) ×5
IRRIGATION SUCT STRKRFLW 2 WTP (MISCELLANEOUS) ×3 IMPLANT
LEGGING LITHOTOMY PAIR STRL (DRAPES) IMPLANT
MANIFOLD NEPTUNE II (INSTRUMENTS) ×5 IMPLANT
PACK COLON (CUSTOM PROCEDURE TRAY) ×5 IMPLANT
PACK CYSTO (CUSTOM PROCEDURE TRAY) ×5 IMPLANT
PAD POSITIONING PINK XL (MISCELLANEOUS) ×5 IMPLANT
PORT LAP GEL ALEXIS MED 5-9CM (MISCELLANEOUS) ×10 IMPLANT
RELOAD STAPLER GREEN 60MM (STAPLE) ×9 IMPLANT
RELOAD STAPLER WHITE 60MM (STAPLE) IMPLANT
RTRCTR WOUND ALEXIS 18CM MED (MISCELLANEOUS)
SCISSORS LAP 5X35 DISP (ENDOMECHANICALS) ×5 IMPLANT
SEALER TISSUE X1 CVD JAW (INSTRUMENTS) IMPLANT
SHEARS HARMONIC ACE PLUS 36CM (ENDOMECHANICALS) ×5 IMPLANT
SLEEVE XCEL OPT CAN 5 100 (ENDOMECHANICALS) ×25 IMPLANT
STAPLER CIRC ILS CVD 25MM (STAPLE) ×5 IMPLANT
STAPLER ECHELON LONG 60 440 (INSTRUMENTS) ×5 IMPLANT
STAPLER RELOAD GREEN 60MM (STAPLE) ×15
STAPLER RELOAD WHITE 60MM (STAPLE)
STAPLER VISISTAT 35W (STAPLE) ×5 IMPLANT
STRIP CLOSURE SKIN 1/2X4 (GAUZE/BANDAGES/DRESSINGS) ×4 IMPLANT
SUT PDS AB 0 CT1 36 (SUTURE) ×10 IMPLANT
SUT PROLENE 2 0 KS (SUTURE) ×5 IMPLANT
SUT SILK 2 0 (SUTURE) ×2
SUT SILK 2 0 SH (SUTURE) ×5 IMPLANT
SUT SILK 2 0 SH CR/8 (SUTURE) ×5 IMPLANT
SUT SILK 2-0 18XBRD TIE 12 (SUTURE) ×3 IMPLANT
SUT SILK 3 0 (SUTURE) ×2
SUT SILK 3 0 SH CR/8 (SUTURE) ×5 IMPLANT
SUT SILK 3-0 18XBRD TIE 12 (SUTURE) ×3 IMPLANT
SUT VIC AB 2-0 SH 27 (SUTURE)
SUT VIC AB 2-0 SH 27X BRD (SUTURE) IMPLANT
SYS LAPSCP GELPORT 120MM (MISCELLANEOUS)
SYSTEM LAPSCP GELPORT 120MM (MISCELLANEOUS) IMPLANT
TOWEL OR 17X26 10 PK STRL BLUE (TOWEL DISPOSABLE) IMPLANT
TOWEL OR NON WOVEN STRL DISP B (DISPOSABLE) ×5 IMPLANT
TRAY FOLEY W/METER SILVER 16FR (SET/KITS/TRAYS/PACK) IMPLANT
TROCAR BLADELESS OPT 5 100 (ENDOMECHANICALS) ×5 IMPLANT
TROCAR XCEL 12X100 BLDLESS (ENDOMECHANICALS) ×5 IMPLANT
TUBING CONNECTING 10 (TUBING) ×12 IMPLANT
TUBING CONNECTING 10' (TUBING) ×3
TUBING INSUF HEATED (TUBING) ×5 IMPLANT

## 2017-06-07 NOTE — H&P (Signed)
I was asked to place stents in preparation for her colon surgery.  I have reviewed her CT films.    I have explained the procedure to [facility] and reviewed the risks of bleeding, infection and ureteral injury as well as the risk of anesthetic complications.

## 2017-06-07 NOTE — Transfer of Care (Signed)
Immediate Anesthesia Transfer of Care Note  Patient: Jasmine Cobb  Procedure(s) Performed: LAPAROSCOPIC SIGMOID COLON RESECTION WITH SMALL BOWEL REPAIR (N/A Abdomen) RIGID PROCTOSCOPY (N/A Rectum) CYSTOSCOPY WITH STENT PLACEMENT (Bilateral Ureter)  Patient Location: PACU  Anesthesia Type:General  Level of Consciousness: awake, oriented, drowsy, patient cooperative, lethargic and responds to stimulation  Airway & Oxygen Therapy: Patient Spontanous Breathing and Patient connected to face mask oxygen  Post-op Assessment: Report given to RN, Post -op Vital signs reviewed and stable and Patient moving all extremities  Post vital signs: Reviewed and stable  Last Vitals:  Vitals:   06/07/17 0713  BP: 129/85  Pulse: 97  Resp: 18  Temp: 36.9 C  SpO2: 100%    Last Pain:  Vitals:   06/07/17 0713  TempSrc: Oral         Complications: No apparent anesthesia complications

## 2017-06-07 NOTE — H&P (Signed)
Jasmine Cobb is an 51 y.o. female.   Chief Complaint: diverticulitis HPI: 51 yo female with recurrent diverticulitis requiring hospitalization and multiple courses of antibiotics presents for partial colectomy with anastomosis. She has had no symptoms in the last 3 weeks and was able to successfully complete the bowel prep.  Past Medical History:  Diagnosis Date  . Anxiety   . Diverticulitis   . Hypertension   . Migraine     Past Surgical History:  Procedure Laterality Date  . IR GENERIC HISTORICAL  05/11/2016   IR CM INJ ANY COLONIC TUBE W/FLUORO 05/11/2016 [facility]-INTERV RAD  . TUBAL LIGATION      Family History  Problem Relation Age of Onset  . Breast cancer Maternal Grandmother    Social History:  reports that she has quit smoking. Her smoking use included Cigarettes. She has quit using smokeless tobacco. She reports that she drinks alcohol. She reports that she uses drugs, including Marijuana.  Allergies:  Allergies  Allergen Reactions  . Lisinopril Nausea Only and Other (See Comments)    Feels sick  . Shellfish Allergy Anaphylaxis  . Ciprofloxacin Swelling  . Penicillins Other (See Comments)    unknown    Medications Prior to Admission  Medication Sig Dispense Refill  . b complex vitamins tablet Take 1 tablet by mouth daily.    Marland Kitchen LINZESS 290 MCG CAPS capsule Take 290 mcg by mouth daily.   0  . losartan (COZAAR) 25 MG tablet Take 25 mg by mouth daily.  0  . polyethylene glycol (MIRALAX / GLYCOLAX) packet Take 17 g by mouth daily as needed for mild constipation.    . vitamin C (ASCORBIC ACID) 500 MG tablet Take 500 mg by mouth daily.    Marland Kitchen VITAMIN E PO Take 1 capsule by mouth daily.    Marland Kitchen lactobacillus acidophilus & bulgar (LACTINEX) chewable tablet Chew 1 tablet by mouth 3 (three) times daily with meals. (Patient not taking: Reported on 01/18/2017) 10 tablet 0  . ondansetron (ZOFRAN) 4 MG tablet Take 1 tablet (4 mg total) by mouth daily as needed for nausea or vomiting.  (Patient not taking: Reported on 05/25/2017) 20 tablet 0    No results found for this or any previous visit (from the past 48 hour(s)). No results found.  Review of Systems  Constitutional: Negative for chills and fever.  HENT: Negative for hearing loss.   Eyes: Negative for blurred vision and double vision.  Respiratory: Negative for cough and hemoptysis.   Cardiovascular: Negative for chest pain and palpitations.  Gastrointestinal: Negative for abdominal pain, nausea and vomiting.  Genitourinary: Negative for dysuria and urgency.  Musculoskeletal: Negative for myalgias and neck pain.  Skin: Negative for itching and rash.  Neurological: Negative for dizziness, tingling and headaches.  Endo/Heme/Allergies: Does not bruise/bleed easily.  Psychiatric/Behavioral: Negative for depression and suicidal ideas.    Blood pressure 129/85, pulse 97, temperature 98.4 F (36.9 C), temperature source Oral, resp. rate 18, height 5\' 5"  (1.651 m), weight 92.5 kg (204 lb), last menstrual period 05/02/2017, SpO2 100 %. Physical Exam  Vitals reviewed. Constitutional: She is oriented to person, place, and time. She appears well-developed and well-nourished.  HENT:  Head: Normocephalic and atraumatic.  Eyes: Pupils are equal, round, and reactive to light. Conjunctivae and EOM are normal.  Neck: Normal range of motion. Neck supple.  Cardiovascular: Normal rate and regular rhythm.   Respiratory: Effort normal and breath sounds normal.  GI: Soft. Bowel sounds are normal. She exhibits no distension.  There is no tenderness.  Musculoskeletal: Normal range of motion.  Neurological: She is alert and oriented to person, place, and time.  Skin: Skin is warm and dry.  Psychiatric: She has a normal mood and affect. Her behavior is normal.     Assessment/Plan 51 yo female with recurrent diverticulitis -lap sigmoidectomy -inpatient hospitalization -[facility] -urology to place perioperative stents for ureter  identification  Mickeal Skinner, MD 06/07/2017, 9:04 AM

## 2017-06-07 NOTE — Anesthesia Procedure Notes (Signed)
Procedure Name: Intubation Date/Time: 06/07/2017 10:05 AM Performed by: Janeece Riggers Pre-anesthesia Checklist: Patient identified, Emergency Drugs available, Suction available, Timeout performed and Patient being monitored Patient Re-evaluated:Patient Re-evaluated prior to induction Oxygen Delivery Method: Circle system utilized Preoxygenation: Pre-oxygenation with 100% oxygen Induction Type: IV induction and Cricoid Pressure applied Ventilation: Mask ventilation without difficulty Laryngoscope Size: Mac and 3 Grade View: Grade I Tube size: 7.0 mm Number of attempts: 1 Placement Confirmation: ETT inserted through vocal cords under direct vision,  positive ETCO2 and breath sounds checked- equal and bilateral Secured at: 21 cm Tube secured with: Tape Dental Injury: Teeth and Oropharynx as per pre-operative assessment

## 2017-06-07 NOTE — Op Note (Signed)
Preoperative diagnosis: recurrent complicated diverticulitis  Postoperative diagnosis: same   Procedure: laparoscopic sigmoidectomy, laparoscopic enterrorhaphy  Surgeon: Gurney Maxin, M.D.  Asst: Jackolyn Confer  Anesthesia: general  Indications for procedure: Jasmine Cobb is a 51 y.o. year old female with symptoms of abdominal pain found to have diverticulitis requiring hospitalization and multiple outpatient treatments with antibiotics.  Description of procedure: The patient was brought into the operative suite. Anesthesia was administered with General endotracheal anesthesia. WHO checklist was applied. The patient was then placed in lithotomy position. Next, Dr. Jeffie Pollock performed cystoscopy and placed bilateral ureteral stents. The area was prepped and draped in the usual sterile fashion.  Next, a left subcostal incision was made and a 76mm optical entry trocar was used to gain access to the abdominal cavity. No peritoneum was applied with a high flow low pressure. Lap scope was reinserted to confirm placement. On initial scanning of the abdomen there was some filmy adhesive disease of the omentum to the mid abdominal wall. 3 additional trochars are placed one 5 mm in the left upper abdomen, one 5 mm in the infraumbilical space, and one 5 mm trocar in the left lower abdomen. He filmy adhesive disease was taken down with Harmonic scalpel. Patient was then placed in the left side up, Trendelenburg position. On initial assessment of the colon, the sigmoid colon was boggy and inflamed and adhesed to the abdominal cavity as well as a portion of the terminal ileum. Sharp dissection was used to remove filmy adhesions from the colon to the abdominal wall. Next the white line of Toldt was incised with scissors. Next, the adhesion of the small intestine and the small intestine mesentery to the medial aspect sigmoid colon was bluntly dissected. Once this was partially dissected then the medial approach to  the sigmoid mesentery was performed and the ureter on the left side was appropriately identified.  At this time we then turned our attention towards the left colon and splenic flexure. Splenic flexure was fully dissected down using Harmonic scalpel. There multiple adhesions of the colon to the omentum is taken down with sharp dissection or Harmonic scalpel.  Once appropriate mobilization was done, attention was turned back to sigmoid colon and distal lateral dissection was done to the inflamed sigmoid portion of to remove more adhesions of the abdominal wall in order to mobilize this portion. Once this was done very able to find more of a plane of the adhesions to the small intestine these were taken down with further blunt and sharp dissection. The adhesions to the small intestine were completely dissected free. Next, I incised the medial peritoneal reflection of the mesentery along the sigmoid peritoneum. We identified the ureter and then began taking the sigmoid mesentery with the Harmonic scalpel. This is very tedious due to the amount of inflammation. At one point blunt dissection allowed [place] to access a small abscess cavity with small amount of pus drainage. We proceeded in this fashion moving distally.   Due to difficulty with visualization, I decided to divide proximal aspect of sigmoid colon. Therefore I upsized the left lower quadrant port to a 12 mm. I used a echelon green load 60 mm stapler to divide the proximal sigmoid colon. This allowed better maneuverability of the sigmoid colon were able to identify mesentery as well as the rectus sigmoid junction. However it was still difficult to visualize the lateral aspect of the rectosigmoid junction. Therefore I made a small infraumbilical midline incision and placing the wound protector was able to  bluntly dissect with my hand the adhesions at the rectosigmoid junction. Returning laparoscopically as able take the Harmonic scalpel to divide the remainder  of the sigmoid mesentery. Next we were able to take the rectosigmoid junction and divided with 2 green load 60 mm echelon staplers. The distal aspect of the stapler was slightly above the peritoneal reflection of the pelvis. Specimen was removed and marked as silk stitch marks distal.  The distal left colon was brought out the midline incision and a pursestring device was used to create a pursestring which was reinforced with multiple 2 and 3-0 silk's. Dilators were used to size and the 25 mm dilator fit easily and the 29 mm dilator created multiple serosal tears in the area, therefore a 25 mm EEA stapler was chosen for the anastomosis. Anvil was put in place and the pursestring secured to the area. The fatty tissue over the colon at the area of the anvil was lysed free for a clean anastomotic area.  At this time Dr. Nils Flack went below to perform the rectoanal part of the anastomosis. The stapler was advanced up to the distal staple line and spike brought out and a EEA anastomosis was performed in standard laparoscopic fashion. Dr. was noted and performed a rigid proctoscopy and leak test was performed with no leaks identified.  Next I ran the small intestine from the ileocecal valve back proximally to identify the area had dissected bowel. There is multiple serosal tears as well as some dissection of the mesentery of the area. I placed 2 3-0 silk's to this area Lembert fashion to close the serosal tear area. Next a irrigated the abdomen with an antibiotic solution. Hemostasis appeared intact.  Pneumoperitoneum was evacuated, all ports were removed, clean changeover of instruments and gowns and drapes was performed, and the fascial incision of the midline incision was closed with a running 0 PDS. 4-0 Monocryl septic or fashion was used to close all skin incisions. Dressings were put in place. Patient awoke from anesthesia and was brought to PACU in stable condition  Findings: sigmoid diverticulitis with  mural abscess, adhesive disease to the distal ileum and abdominal wall.  -partial thickness injury to small bowel with good serosal repair  -76mm diameter left colon, 62mm EEA stapled anastomosis with negative leak test  Specimen: sigmoid colon, silk marks distal  Implant: none   Blood loss: 164ml  Local anesthesia: 50 ml Exparel:Marcaine Mix  Complications: partial thickness tear of bowel in dissection with appropriate repair  Gurney Maxin, M.D. General, Bariatric, & Minimally Invasive Surgery [facility]

## 2017-06-07 NOTE — Anesthesia Preprocedure Evaluation (Addendum)
Anesthesia Evaluation  Patient identified by MRN, date of birth, ID band Patient awake    Reviewed: Allergy & Precautions, NPO status , Patient's Chart, lab work & pertinent test results  Airway Mallampati: II  TM Distance: >3 FB Neck ROM: Full    Dental no notable dental hx. (+) Loose, Poor Dentition, Missing   Pulmonary neg pulmonary ROS, former smoker,    Pulmonary exam normal breath sounds clear to auscultation       Cardiovascular hypertension, negative cardio ROS Normal cardiovascular exam Rhythm:Regular Rate:Normal     Neuro/Psych  Headaches, negative neurological ROS  negative psych ROS   GI/Hepatic negative GI ROS, Neg liver ROS,   Endo/Other  negative endocrine ROS  Renal/GU negative Renal ROS  negative genitourinary   Musculoskeletal negative musculoskeletal ROS (+)   Abdominal   Peds negative pediatric ROS (+)  Hematology negative hematology ROS (+)   Anesthesia Other Findings   Reproductive/Obstetrics negative OB ROS                           Anesthesia Physical Anesthesia Plan  ASA: II  Anesthesia Plan: General   Post-op Pain Management:    Induction: Intravenous  PONV Risk Score and Plan: 2 and Ondansetron, Dexamethasone, Treatment may vary due to age or medical condition, Midazolam and Scopolamine patch - Pre-op  Airway Management Planned: Oral ETT  Additional Equipment:   Intra-op Plan:   Post-operative Plan: Extubation in OR  Informed Consent:   Dental advisory given  Plan Discussed with:   Anesthesia Plan Comments: (  )       Anesthesia Quick Evaluation

## 2017-06-07 NOTE — Anesthesia Procedure Notes (Signed)
Date/Time: 06/07/2017 10:05 AM Performed by: Janeece Riggers Pre-anesthesia Checklist: Patient identified, Emergency Drugs available, Suction available, Patient being monitored and Timeout performed Patient Re-evaluated:Patient Re-evaluated prior to induction Oxygen Delivery Method: Circle system utilized Preoxygenation: Pre-oxygenation with 100% oxygen Induction Type: IV induction and Cricoid Pressure applied Ventilation: Mask ventilation without difficulty Laryngoscope Size: Mac and 3 Grade View: Grade II Tube type: Oral Tube size: 7.0 mm Number of attempts: 1 Airway Equipment and Method: Stylet Placement Confirmation: ETT inserted through vocal cords under direct vision,  positive ETCO2 and breath sounds checked- equal and bilateral Secured at: 21 cm Tube secured with: Tape Dental Injury: Teeth and Oropharynx as per pre-operative assessment  Comments: Loose bottom teeth pre-induction intact post intubation.

## 2017-06-07 NOTE — Anesthesia Postprocedure Evaluation (Signed)
Anesthesia Post Note  Patient: Jasmine Cobb  Procedure(s) Performed: LAPAROSCOPIC SIGMOID COLON RESECTION WITH SMALL BOWEL REPAIR (N/A Abdomen) RIGID PROCTOSCOPY (N/A Rectum) CYSTOSCOPY WITH STENT PLACEMENT (Bilateral Ureter)     Patient location during evaluation: PACU Anesthesia Type: General Level of consciousness: awake and alert Pain management: pain level controlled Vital Signs Assessment: post-procedure vital signs reviewed and stable Respiratory status: spontaneous breathing, nonlabored ventilation, respiratory function stable and patient connected to nasal cannula oxygen Cardiovascular status: blood pressure returned to baseline and stable Postop Assessment: no apparent nausea or vomiting Anesthetic complications: no    Last Vitals:  Vitals:   06/07/17 1545 06/07/17 1555  BP: 120/82 123/82  Pulse: 95 94  Resp: 12 14  Temp:  36.4 C  SpO2: 97% 97%    Last Pain:  Vitals:   06/07/17 1555  TempSrc:   PainSc: Asleep                 Haylen Shelnutt

## 2017-06-08 ENCOUNTER — Encounter (HOSPITAL_COMMUNITY): Payer: Self-pay | Admitting: General Surgery

## 2017-06-08 LAB — MAGNESIUM: MAGNESIUM: 2 mg/dL (ref 1.7–2.4)

## 2017-06-08 LAB — BASIC METABOLIC PANEL
ANION GAP: 11 (ref 5–15)
BUN: 9 mg/dL (ref 6–20)
CO2: 22 mmol/L (ref 22–32)
Calcium: 8.1 mg/dL — ABNORMAL LOW (ref 8.9–10.3)
Chloride: 106 mmol/L (ref 101–111)
Creatinine, Ser: 0.98 mg/dL (ref 0.44–1.00)
GFR calc Af Amer: >60 mL/min (ref >60)
GLUCOSE: 101 mg/dL — AB (ref 65–99)
POTASSIUM: 4 mmol/L (ref 3.5–5.1)
Sodium: 139 mmol/L (ref 135–145)

## 2017-06-08 LAB — CBC
HCT: 37.9 % (ref 36.0–46.0)
Hemoglobin: 12.8 g/dL (ref 12.0–15.0)
MCH: 32.2 pg (ref 26.0–34.0)
MCHC: 33.8 g/dL (ref 30.0–36.0)
MCV: 95.2 fL (ref 78.0–100.0)
PLATELETS: 282 10*3/uL (ref 150–400)
RBC: 3.98 MIL/uL (ref 3.87–5.11)
RDW: 12.6 % (ref 11.5–15.5)
WBC: 17.5 10*3/uL — AB (ref 4.0–10.5)

## 2017-06-08 LAB — PHOSPHORUS: Phosphorus: 3.2 mg/dL (ref 2.5–4.6)

## 2017-06-08 MED ORDER — ENSURE ENLIVE PO LIQD
237.0000 mL | Freq: Two times a day (BID) | ORAL | Status: DC
  Administered 2017-06-08: 237 mL via ORAL

## 2017-06-08 NOTE — Op Note (Deleted)
  The note originally documented on this encounter has been moved the the encounter in which it belongs.  

## 2017-06-08 NOTE — Consult Note (Signed)
[place] Nurse ostomy consult note In to assess patient.  No stoma needed.  Patient is smiling and pleased.  She shows me her laprascopic sites with band aids on them.  No further WOC needs at this time.  Will not follow at this time.  Please re-consult if needed.  Domenic Moras RN BSN [place] Pager [phone]

## 2017-06-08 NOTE — Op Note (Signed)
[name], Jasmine Cobb             ACCOUNT NO.:  [id number]  MEDICAL RECORD NO.:  [id number]  LOCATION:                                 FACILITY:  PHYSICIAN:  Marshall Cork. Jeffie Pollock, M.D.    DATE OF BIRTH:  June 21, 1966  DATE OF PROCEDURE:  06/07/2017 DATE OF DISCHARGE:  06/09/2017                              OPERATIVE REPORT   PROCEDURE:  Cystoscopy with insertion of bilateral ureteral catheters.  PREOPERATIVE DIAGNOSIS:  Diverticulitis.  POSTOPERATIVE DIAGNOSIS:  Diverticulitis.  SURGEON:  Marshall Cork. Jeffie Pollock, M.D.  ANESTHESIA:  General.  SPECIMEN:  None.  DRAINS:  Bilateral 5-French ureteral catheters and 14-French Foley catheter.  BLOOD LOSS:  None.  COMPLICATIONS:  None.  INDICATIONS:  Ms. Eager is a 51 year old African American female with diverticular disease, who is to undergo colon resection today.  Ureteral catheters were requested for ureteral identification.  FINDINGS AND PROCEDURE:  She was taken to the operating room where a general anesthetic was induced.  She had received antibiotics per Dr. Kieth Brightly.  She was placed in lithotomy position.  Her perineum and genitalia were prepped with Hibiclens.  She had been fitted with PAS hose.  She was draped in usual sterile fashion.  Cystoscopy was performed using the 23-French scope and 30-degree lens.  Examination revealed normal urethra, the bladder wall had mild trabeculation.  No tumors, stones or inflammation were noted.  Ureteral orifices were unremarkable.  The left ureteral orifice was cannulated with a 5-French open-ended catheter, which was passed easily to the kidney under fluoroscopic guidance.  This was followed by cannulation of the right ureteral orifice with a 5-French open-ended catheter, which was passed to the kidney under fluoroscopic guidance without difficulty.  The cystoscope was then removed.  A 14-French Foley catheter was inserted.  The balloon was filled with 10 mL of sterile fluid.   The ureteral catheters were secured to the urethral catheter using 2-0 silk ties and all of the catheters were secured to drainage via a Goldberg device.  The patient was then taken down from lithotomy position and re- prepped for the colon resection.  There were no complications during my portion of the procedure.     Marshall Cork. Jeffie Pollock, M.D.   ______________________________ Marshall Cork. Jeffie Pollock, M.D.    JJW/MEDQ  D:  06/07/2017  T:  06/08/2017  Job:  [id number]

## 2017-06-08 NOTE — Progress Notes (Signed)
Progress Note: General Surgery Service   Assessment/Plan: Patient Active Problem List   Diagnosis Date Noted  . Diverticulitis, colon 06/07/2017  . Uterine leiomyoma 01/18/2017  . Former smoker 01/18/2017  . Pelvic pain   . Essential hypertension 01/06/2017  . Diverticulitis of large intestine with abscess 01/06/2017  . Diverticulitis of large intestine with abscess without bleeding 05/05/2016   s/p Procedure(s): LAPAROSCOPIC SIGMOID COLON RESECTION WITH SMALL BOWEL REPAIR CYSTOSCOPY WITH STENT PLACEMENT RIGID PROCTOSCOPY 06/07/2017 -pain well controlled, continue current plan -ensure today, await for bowel return -remove foley -continue entereg -continue ambulation -labs with appropriate rise and decrease    LOS: 1 day  Chief Complaint/Subjective: Pain well controlled, numbness partially worn off, no flatus, no nausea, tolerating liquids well, ambulating well   Objective: Vital signs in last 24 hours: Temp:  [97.6 F (36.4 C)-99.5 F (37.5 C)] 97.8 F (36.6 C) (10/12 0510) Pulse Rate:  [76-95] 76 (10/12 0510) Resp:  [11-18] 18 (10/12 0510) BP: (113-137)/(66-92) 113/76 (10/12 0510) SpO2:  [95 %-100 %] 97 % (10/12 0510) Weight:  [92.5 kg (204 lb)] 92.5 kg (204 lb) (10/11 0811)    Intake/Output from previous day: 10/11 0701 - 10/12 0700 In: 3017.5 [P.O.:120; I.V.:2897.5] Out: 2575 [Urine:2550; Blood:25] Intake/Output this shift: No intake/output data recorded.  Lungs: CTAB  Cardiovascular: RRR  Abd: soft, ATTP, incisions c/d/i, urine clear  Extremities: no edema  Neuro: AOx4  Lab Results: CBC   Recent Labs  06/07/17 1645 06/08/17 0527  WBC 18.4* 17.5*  HGB 13.5 12.8  HCT 38.4 37.9  PLT 243 282   BMET  Recent Labs  06/07/17 1645 06/08/17 0527  NA  --  139  K  --  4.0  CL  --  106  CO2  --  22  GLUCOSE  --  101*  BUN  --  9  CREATININE 0.98 0.98  CALCIUM  --  8.1*   PT/INR No results for input(s): LABPROT, INR in the last 72  hours. ABG No results for input(s): PHART, HCO3 in the last 72 hours.  Invalid input(s): PCO2, PO2  Studies/Results:  Anti-infectives: Anti-infectives    Start     Dose/Rate Route Frequency Ordered Stop   06/07/17 1422  clindamycin (CLEOCIN) 900 mg, gentamicin (GARAMYCIN) 240 mg in sodium chloride 0.9 % 1,000 mL for intraperitoneal lavage  Status:  Discontinued       As needed 06/07/17 1423 06/07/17 1443   06/07/17 0915  clindamycin (CLEOCIN) 900 mg, gentamicin (GARAMYCIN) 240 mg in sodium chloride 0.9 % 1,000 mL for intraperitoneal lavage  Status:  Discontinued      Intraperitoneal To Surgery 06/06/17 1216 06/07/17 1622   06/07/17 0845  gentamicin (GARAMYCIN) 360 mg in dextrose 5 % 100 mL IVPB  Status:  Discontinued     5 mg/kg  71.4 kg (Adjusted) 109 mL/hr over 60 Minutes Intravenous On call to O.R. 06/07/17 8182 06/07/17 0837   06/07/17 0845  clindamycin (CLEOCIN) IVPB 900 mg  Status:  Discontinued     900 mg 100 mL/hr over 30 Minutes Intravenous On call to O.R. 06/07/17 9937 06/07/17 0837   06/07/17 0845  gentamicin (GARAMYCIN) 360 mg in dextrose 5 % 100 mL IVPB     5 mg/kg  71.4 kg (Adjusted) 218 mL/hr over 30 Minutes Intravenous 60 min pre-op 06/06/17 1231 06/07/17 0945   06/07/17 0706  neomycin (MYCIFRADIN) tablet 1,000 mg  Status:  Discontinued     1,000 mg Oral 3 times per day 06/07/17 0706 06/07/17  1622   06/07/17 0706  metroNIDAZOLE (FLAGYL) tablet 1,000 mg  Status:  Discontinued     1,000 mg Oral 3 times per day 06/07/17 0706 06/07/17 1622   06/06/17 1231  clindamycin (CLEOCIN) IVPB 900 mg     900 mg 100 mL/hr over 30 Minutes Intravenous 60 min pre-op 06/06/17 1231 06/07/17 0930      Medications: Scheduled Meds: . alvimopan  12 mg Oral BID  . enoxaparin (LOVENOX) injection  40 mg Subcutaneous Q24H  . feeding supplement (ENSURE ENLIVE)  237 mL Oral BID BM  . gabapentin  300 mg Oral BID  . Influenza vac split quadrivalent PF  0.5 mL Intramuscular Tomorrow-1000  .  losartan  25 mg Oral Daily   Continuous Infusions: . sodium chloride 75 mL/hr at 06/08/17 0753  . lactated ringers 50 mL/hr at 06/07/17 0826   PRN Meds:.diphenhydrAMINE **OR** diphenhydrAMINE, hydrALAZINE, HYDROcodone-acetaminophen, [COMPLETED] ketorolac **FOLLOWED BY** ketorolac, morphine injection, ondansetron **OR** ondansetron (ZOFRAN) IV, simethicone, traMADol  Mickeal Skinner, MD Pg# [phone] [facility]

## 2017-06-09 MED ORDER — TRAMADOL HCL 50 MG PO TABS: 50.0000 mg | ORAL_TABLET | Freq: Four times a day (QID) | ORAL | 0 refills | Status: DC | PRN

## 2017-06-09 MED ORDER — LINZESS 290 MCG PO CAPS
290.0000 ug | ORAL_CAPSULE | Freq: Every day | ORAL | 0 refills | Status: DC
Start: 2017-06-21 — End: 2017-10-22

## 2017-06-09 NOTE — Progress Notes (Signed)
Progress Note: General Surgery Service   Assessment/Plan: Patient Active Problem List   Diagnosis Date Noted  . Diverticulitis, colon 06/07/2017  . Uterine leiomyoma 01/18/2017  . Former smoker 01/18/2017  . Pelvic pain   . Essential hypertension 01/06/2017  . Diverticulitis of large intestine with abscess 01/06/2017  . Diverticulitis of large intestine with abscess without bleeding 05/05/2016   s/p Procedure(s): LAPAROSCOPIC SIGMOID COLON RESECTION WITH SMALL BOWEL REPAIR CYSTOSCOPY WITH STENT PLACEMENT RIGID PROCTOSCOPY 06/07/2017 -pain well controlled, continue current plan -bowel function returned: advance to solid foods -d/c entereg -continue ambulation -labs appropriate    LOS: 2 days  Chief Complaint/Subjective: Pain well controlled, having flatus and BM's, no nausea, tolerating liquids well, ambulating well   Objective: Vital signs in last 24 hours: Temp:  [98.6 F (37 C)-98.9 F (37.2 C)] 98.6 F (37 C) (10/13 0500) Pulse Rate:  [79-82] 80 (10/13 0500) Resp:  [18] 18 (10/13 0500) BP: (124-130)/(83-92) 130/92 (10/13 0500) SpO2:  [100 %] 100 % (10/13 0500)    Intake/Output from previous day: 10/12 0701 - 10/13 0700 In: 1360 [P.O.:1360] Out: 3400 [Urine:3400] Intake/Output this shift: No intake/output data recorded.  Lungs: CTAB  Cardiovascular: RRR  Abd: soft, ATTP, incisions c/d/i, urine clear  Extremities: no edema  Neuro: AOx4  Lab Results: CBC   Recent Labs  06/07/17 1645 06/08/17 0527  WBC 18.4* 17.5*  HGB 13.5 12.8  HCT 38.4 37.9  PLT 243 282   BMET  Recent Labs  06/07/17 1645 06/08/17 0527  NA  --  139  K  --  4.0  CL  --  106  CO2  --  22  GLUCOSE  --  101*  BUN  --  9  CREATININE 0.98 0.98  CALCIUM  --  8.1*   PT/INR No results for input(s): LABPROT, INR in the last 72 hours. ABG No results for input(s): PHART, HCO3 in the last 72 hours.  Invalid input(s): PCO2,  PO2  Studies/Results:  Anti-infectives: Anti-infectives    Start     Dose/Rate Route Frequency Ordered Stop   06/07/17 1422  clindamycin (CLEOCIN) 900 mg, gentamicin (GARAMYCIN) 240 mg in sodium chloride 0.9 % 1,000 mL for intraperitoneal lavage  Status:  Discontinued       As needed 06/07/17 1423 06/07/17 1443   06/07/17 0915  clindamycin (CLEOCIN) 900 mg, gentamicin (GARAMYCIN) 240 mg in sodium chloride 0.9 % 1,000 mL for intraperitoneal lavage  Status:  Discontinued      Intraperitoneal To Surgery 06/06/17 1216 06/07/17 1622   06/07/17 0845  gentamicin (GARAMYCIN) 360 mg in dextrose 5 % 100 mL IVPB  Status:  Discontinued     5 mg/kg  71.4 kg (Adjusted) 109 mL/hr over 60 Minutes Intravenous On call to O.R. 06/07/17 0630 06/07/17 0837   06/07/17 0845  clindamycin (CLEOCIN) IVPB 900 mg  Status:  Discontinued     900 mg 100 mL/hr over 30 Minutes Intravenous On call to O.R. 06/07/17 1601 06/07/17 0837   06/07/17 0845  gentamicin (GARAMYCIN) 360 mg in dextrose 5 % 100 mL IVPB     5 mg/kg  71.4 kg (Adjusted) 218 mL/hr over 30 Minutes Intravenous 60 min pre-op 06/06/17 1231 06/07/17 0945   06/07/17 0706  neomycin (MYCIFRADIN) tablet 1,000 mg  Status:  Discontinued     1,000 mg Oral 3 times per day 06/07/17 0706 06/07/17 1622   06/07/17 0706  metroNIDAZOLE (FLAGYL) tablet 1,000 mg  Status:  Discontinued     1,000 mg Oral  3 times per day 06/07/17 0706 06/07/17 1622   06/06/17 1231  clindamycin (CLEOCIN) IVPB 900 mg     900 mg 100 mL/hr over 30 Minutes Intravenous 60 min pre-op 06/06/17 1231 06/07/17 0930      Medications: Scheduled Meds: . enoxaparin (LOVENOX) injection  40 mg Subcutaneous Q24H  . feeding supplement (ENSURE ENLIVE)  237 mL Oral BID BM  . gabapentin  300 mg Oral BID  . losartan  25 mg Oral Daily   Continuous Infusions: . lactated ringers 50 mL/hr at 06/07/17 0826   PRN Meds:.diphenhydrAMINE **OR** diphenhydrAMINE, hydrALAZINE, HYDROcodone-acetaminophen, [COMPLETED]  ketorolac **FOLLOWED BY** ketorolac, morphine injection, ondansetron **OR** ondansetron (ZOFRAN) IV, simethicone, traMADol   Rosario Adie, MD  [facility]

## 2017-06-09 NOTE — Progress Notes (Signed)
Discharge and medication instructions reviewed with patient and questions answered. Patient denies further questions. One prescription given to patient. Patient has called someone to drive her home. Donne Hazel, RN

## 2017-06-09 NOTE — Plan of Care (Signed)
Problem: Pain Managment: Goal: General experience of comfort will improve Outcome: Progressing Medicated once for pain with moderate relief  Problem: Physical Regulation: Goal: Will remain free from infection Outcome: Progressing No signs of infection noted, VS WNL  Problem: Tissue Perfusion: Goal: Risk factors for ineffective tissue perfusion will decrease Outcome: Progressing SCDs are on, patient ambulates independently, she is oLovenox   Problem: Activity: Goal: Risk for activity intolerance will decrease Outcome: Progressing Tolerates activity well  Problem: Bowel/Gastric: Goal: Will not experience complications related to bowel motility Outcome: Progressing No bowel complications reported, patient had small soft BM this shift

## 2017-06-09 NOTE — Discharge Instructions (Signed)

## 2017-06-09 NOTE — Discharge Summary (Signed)
Physician Discharge Summary  Patient ID: Jasmine Cobb MRN: [id number] DOB/AGE: [birth year] 51 y.o.  Admit date: 06/07/2017 Discharge date: 06/09/2017  Admission Diagnoses: Diverticular disease  Discharge Diagnoses:  Active Problems:   Diverticulitis, colon   Discharged Condition: good  Hospital Course: Ptadmitted after surgery.  Diet was advanced as tolerated.  By POD 2 she was tolerating a diet and having bowel function.  Her pain was controlled and she was ambulating without difficulty.  Consults: None  Significant Diagnostic Studies: labs: cbc, chemistry  Treatments: IV hydration and surgery: lap sigmoidectomy  Discharge Exam: Blood pressure (!) 130/92, pulse 80, temperature 98.6 F (37 C), temperature source Oral, resp. rate 18, height 5\' 5"  (1.651 m), weight 92.5 kg (204 lb), last menstrual period 05/02/2017, SpO2 100 %. General appearance: alert and cooperative GI: normal findings: soft, non-tender Incision/Wound: clean, dry, intact  Disposition: 01-Home or Self Care   Allergies as of 06/09/2017      Reactions   Lisinopril Nausea Only, Other (See Comments)   Feels sick   Shellfish Allergy Anaphylaxis   Ciprofloxacin Swelling   Penicillins Other (See Comments)   unknown      Medication List    TAKE these medications   b complex vitamins tablet Take 1 tablet by mouth daily.   lactobacillus acidophilus & bulgar chewable tablet Chew 1 tablet by mouth 3 (three) times daily with meals.   LINZESS 290 MCG Caps capsule Generic drug:  linaclotide Take 1 capsule (290 mcg total) by mouth daily. Start taking on:  06/21/2017 What changed:  These instructions start on 06/21/2017. If you are unsure what to do until then, ask your doctor or other care provider.   losartan 25 MG tablet Commonly known as:  COZAAR Take 25 mg by mouth daily.   ondansetron 4 MG tablet Commonly known as:  ZOFRAN Take 1 tablet (4 mg total) by mouth daily as needed for nausea or  vomiting.   polyethylene glycol packet Commonly known as:  MIRALAX / GLYCOLAX Take 17 g by mouth daily as needed for mild constipation.   traMADol 50 MG tablet Commonly known as:  ULTRAM Take 1-2 tablets (50-100 mg total) by mouth every 6 (six) hours as needed (mild pain).   vitamin C 500 MG tablet Commonly known as:  ASCORBIC ACID Take 500 mg by mouth daily.   VITAMIN E PO Take 1 capsule by mouth daily.      Follow-up Information    Kinsinger, Arta Bruce, MD. Schedule an appointment as soon as possible for a visit in 2 week(s).   Specialty:  General Surgery Contact information: [address] [place] [address] [phone]           Signed: Rosario Adie 49/44/9675, 9:47 AM

## 2017-06-19 ENCOUNTER — Ambulatory Visit: Payer: Medicare Other | Attending: Internal Medicine | Admitting: Internal Medicine

## 2017-06-19 ENCOUNTER — Encounter: Payer: Self-pay | Admitting: Internal Medicine

## 2017-06-19 VITALS — BP 118/87 | HR 109 | Temp 98.3°F | Resp 16 | Wt 204.2 lb

## 2017-06-19 DIAGNOSIS — Z880 Allergy status to penicillin: Secondary | ICD-10-CM | POA: Insufficient documentation

## 2017-06-19 DIAGNOSIS — Z23 Encounter for immunization: Secondary | ICD-10-CM | POA: Diagnosis not present

## 2017-06-19 DIAGNOSIS — Z87891 Personal history of nicotine dependence: Secondary | ICD-10-CM | POA: Diagnosis not present

## 2017-06-19 DIAGNOSIS — E669 Obesity, unspecified: Secondary | ICD-10-CM | POA: Diagnosis not present

## 2017-06-19 DIAGNOSIS — Z79899 Other long term (current) drug therapy: Secondary | ICD-10-CM | POA: Insufficient documentation

## 2017-06-19 DIAGNOSIS — I10 Essential (primary) hypertension: Secondary | ICD-10-CM | POA: Diagnosis not present

## 2017-06-19 DIAGNOSIS — Z8719 Personal history of other diseases of the digestive system: Secondary | ICD-10-CM

## 2017-06-19 DIAGNOSIS — Z6830 Body mass index (BMI) 30.0-30.9, adult: Secondary | ICD-10-CM | POA: Diagnosis not present

## 2017-06-19 DIAGNOSIS — D219 Benign neoplasm of connective and other soft tissue, unspecified: Secondary | ICD-10-CM | POA: Diagnosis not present

## 2017-06-19 DIAGNOSIS — Z9889 Other specified postprocedural states: Secondary | ICD-10-CM | POA: Insufficient documentation

## 2017-06-19 DIAGNOSIS — K5732 Diverticulitis of large intestine without perforation or abscess without bleeding: Secondary | ICD-10-CM | POA: Diagnosis not present

## 2017-06-19 DIAGNOSIS — Z91013 Allergy to seafood: Secondary | ICD-10-CM | POA: Insufficient documentation

## 2017-06-19 DIAGNOSIS — Z9049 Acquired absence of other specified parts of digestive tract: Secondary | ICD-10-CM | POA: Diagnosis not present

## 2017-06-19 NOTE — Patient Instructions (Addendum)
Wait until you had seen you or surgeon in follow-up visit to start exercise program.   Follow a [place] can do it! Limit sugary drinks.  Avoid sodas, sweet tea, sport or energy drinks, or fruit drinks.  Drink water, lo-fat milk, or diet drinks. Limit snack foods.   Cut back on candy, cake, cookies, chips, ice cream.  These are a special treat, only in small amounts. Eat plenty of vegetables.  Especially dark green, red, and orange vegetables. Aim for at least 3 servings a day. More is better! Include fruit in your daily diet.  Whole fruit is much healthier than fruit juice! Limit "white" bread, "white" pasta, "white" rice.   Choose "100% whole grain" products, brown or wild rice. Avoid fatty meats. Try "Meatless Monday" and choose eggs or beans one day a week.  When eating meat, choose lean meats like chicken, [place], and fish.  Grill, broil, or bake meats instead of frying, and eat poultry without the skin. Eat less salt.  Avoid frozen pizzas, frozen dinners and salty foods.  Use seasonings other than salt in cooking.  This can help blood pressure and keep you from swelling Beer, wine and liquor have calories.  If you can safely drink alcohol, limit to 1 drink per day for women, 2 drinks for men  Td Vaccine (Tetanus and Diphtheria): What You Need to Know 1. Why get vaccinated? Tetanus  and diphtheria are very serious diseases. They are rare in the [place] today, but people who do become infected often have severe complications. Td vaccine is used to protect adolescents and adults from both of these diseases. Both tetanus and diphtheria are infections caused by bacteria. Diphtheria spreads from person to person through coughing or sneezing. Tetanus-causing bacteria enter the body through cuts, scratches, or wounds. TETANUS (lockjaw) causes painful muscle tightening and stiffness, usually all over the body.  It can lead to tightening of muscles in the head and neck so you  can't open your mouth, swallow, or sometimes even breathe. Tetanus kills about 1 out of every 10 people who are infected even after receiving the best medical care.  DIPHTHERIA can cause a thick coating to form in the back of the throat.  It can lead to breathing problems, paralysis, heart failure, and death.  Before vaccines, as many as 200,000 cases of diphtheria and hundreds of cases of tetanus were reported in the [place] each year. Since vaccination began, reports of cases for both diseases have dropped by about 99%. 2. Td vaccine Td vaccine can protect adolescents and adults from tetanus and diphtheria. Td is usually given as a booster dose every 10 years but it can also be given earlier after a severe and dirty wound or burn. Another vaccine, called Tdap, which protects against pertussis in addition to tetanus and diphtheria, is sometimes recommended instead of Td vaccine. Your doctor or the person giving you the vaccine can give you more information. Td may safely be given at the same time as other vaccines. 3. Some people should not get this vaccine  A person who has ever had a life-threatening allergic reaction after a previous dose of any tetanus or diphtheria containing vaccine, OR has a severe allergy to any part of this vaccine, should not get Td vaccine. Tell the person giving the vaccine about any severe allergies.  Talk to your doctor if you: ? had severe pain or swelling after any vaccine containing diphtheria or tetanus, ? ever had  a condition called Guillain Barre Syndrome (GBS), ? aren't feeling well on the day the shot is scheduled. 4. What are the risks from Td vaccine? With any medicine, including vaccines, there is a chance of side effects. These are usually mild and go away on their own. Serious reactions are also possible but are rare. Most people who get Td vaccine do not have any problems with it. Mild problems following Td vaccine: (Did not interfere with  activities)  Pain where the shot was given (about 8 people in 10)  Redness or swelling where the shot was given (about 1 person in 4)  Mild fever (rare)  Headache (about 1 person in 4)  Tiredness (about 1 person in 4)  Moderate problems following Td vaccine: (Interfered with activities, but did not require medical attention)  Fever over 102F (rare)  Severe problems following Td vaccine: (Unable to perform usual activities; required medical attention)  Swelling, severe pain, bleeding and/or redness in the arm where the shot was given (rare).  Problems that could happen after any vaccine:  People sometimes faint after a medical procedure, including vaccination. Sitting or lying down for about 15 minutes can help prevent fainting, and injuries caused by a fall. Tell your doctor if you feel dizzy, or have vision changes or ringing in the ears.  Some people get severe pain in the shoulder and have difficulty moving the arm where a shot was given. This happens very rarely.  Any medication can cause a severe allergic reaction. Such reactions from a vaccine are very rare, estimated at fewer than 1 in a million doses, and would happen within a few minutes to a few hours after the vaccination. As with any medicine, there is a very remote chance of a vaccine causing a serious injury or death. The safety of vaccines is always being monitored. For more information, visit: http://www.aguilar.org/ 5. What if there is a serious reaction? What should I look for? Look for anything that concerns you, such as signs of a severe allergic reaction, very high fever, or unusual behavior. Signs of a severe allergic reaction can include hives, swelling of the face and throat, difficulty breathing, a fast heartbeat, dizziness, and weakness. These would usually start a few minutes to a few hours after the vaccination. What should I do?  If you think it is a severe allergic reaction or other emergency  that can't wait, call 9-1-1 or get the person to the nearest hospital. Otherwise, call your doctor.  Afterward, the reaction should be reported to the Vaccine Adverse Event Reporting System (VAERS). Your doctor might file this report, or you can do it yourself through the VAERS web site at www.vaers.SamedayNews.es, or by calling 364-618-4426. ? VAERS does not give medical advice. 6. The National Vaccine Injury Compensation Program The [employer] Vaccine Injury Compensation Program (VICP) is a federal program that was created to compensate people who may have been injured by certain vaccines. Persons who believe they may have been injured by a vaccine can learn about the program and about filing a claim by calling (762) 736-9212 or visiting the [place] website at GoldCloset.com.ee. There is a time limit to file a claim for compensation. 7. How can I learn more?  Ask your doctor. He or she can give you the vaccine package insert or suggest other sources of information.  Call your local or state health department.  Contact the Centers for Disease Control and Prevention (CDC): ? Call 586-604-9844 (1-800-CDC-INFO) ? Visit CDC's website at http://hunter.com/  CDC Td Vaccine VIS (12/07/15) This information is not intended to replace advice given to you by your health care provider. Make sure you discuss any questions you have with your health care provider. Document Released: 06/11/2006 Document Revised: 05/04/2016 Document Reviewed: 05/04/2016 Elsevier Interactive Patient Education  2017 [employer].

## 2017-06-19 NOTE — Progress Notes (Signed)
Patient ID: Jasmine Cobb, female    DOB: 11/22/1965  MRN: [id number]  CC: Follow-up   Subjective: Jasmine Cobb is a 51 y.o. female who presents for chronic ds management. Her concerns today include:  Hx of HTN, former tobacco, fibroid, diverticulitis.  1. Since last visit patient has had laparoscopic sigmoidectomy for recurrent diverticulitis. Pathology was neg for CA, +positive for diverticulitis. "I feel great." -concern about elev WBC -having normal BMs. Not having to use the Linzess -eating soft diet. Has f/u appt with surgeon 06/28/2017 -some itching of the surgical wound -ready to exercise. Has an eliptical at home  2. HTN: -compliant with Losartan -no HA,CP,SOB  Patient Active Problem List   Diagnosis Date Noted  . Immunization due 06/19/2017  . Diverticulitis, colon 06/07/2017  . Uterine leiomyoma 01/18/2017  . Former smoker 01/18/2017  . Pelvic pain   . Essential hypertension 01/06/2017  . Diverticulitis of large intestine with abscess 01/06/2017  . Diverticulitis of large intestine with abscess without bleeding 05/05/2016     Current Outpatient Prescriptions on File Prior to Visit  Medication Sig Dispense Refill  . b complex vitamins tablet Take 1 tablet by mouth daily.    Marland Kitchen lactobacillus acidophilus & bulgar (LACTINEX) chewable tablet Chew 1 tablet by mouth 3 (three) times daily with meals. (Patient not taking: Reported on 01/18/2017) 10 tablet 0  . [START ON 06/21/2017] LINZESS 290 MCG CAPS capsule Take 1 capsule (290 mcg total) by mouth daily. 30 capsule 0  . losartan (COZAAR) 25 MG tablet Take 25 mg by mouth daily.  0  . ondansetron (ZOFRAN) 4 MG tablet Take 1 tablet (4 mg total) by mouth daily as needed for nausea or vomiting. (Patient not taking: Reported on 05/25/2017) 20 tablet 0  . polyethylene glycol (MIRALAX / GLYCOLAX) packet Take 17 g by mouth daily as needed for mild constipation.    . traMADol (ULTRAM) 50 MG tablet Take 1-2 tablets (50-100 mg  total) by mouth every 6 (six) hours as needed (mild pain). 30 tablet 0  . vitamin C (ASCORBIC ACID) 500 MG tablet Take 500 mg by mouth daily.    Marland Kitchen VITAMIN E PO Take 1 capsule by mouth daily.     No current facility-administered medications on file prior to visit.     Allergies  Allergen Reactions  . Lisinopril Nausea Only and Other (See Comments)    Feels sick  . Shellfish Allergy Anaphylaxis  . Ciprofloxacin Swelling  . Penicillins Other (See Comments)    unknown    Social History   Social History  . Marital status: Divorced    Spouse name: N/A  . Number of children: N/A  . Years of education: N/A   Occupational History  . Not on file.   Social History Main Topics  . Smoking status: Former Smoker    Types: Cigarettes  . Smokeless tobacco: Former Systems developer  . Alcohol use Yes     Comment: occ  . Drug use: Yes    Types: Marijuana     Comment: occasioanally   . Sexual activity: Not Currently    Partners: Male   Other Topics Concern  . Not on file   Social History Narrative  . No narrative on file    Family History  Problem Relation Age of Onset  . Breast cancer Maternal Grandmother     Past Surgical History:  Procedure Laterality Date  . COLON RESECTION N/A 06/07/2017   Procedure: LAPAROSCOPIC SIGMOID COLON RESECTION WITH SMALL BOWEL  REPAIR;  Surgeon: Kinsinger, Arta Bruce, MD;  Location: [facility];  Service: General;  Laterality: N/A;  . CYSTOSCOPY WITH STENT PLACEMENT Bilateral 06/07/2017   Procedure: CYSTOSCOPY WITH STENT PLACEMENT;  Surgeon: Irine Seal, MD;  Location: [facility];  Service: Urology;  Laterality: Bilateral;  . IR GENERIC HISTORICAL  05/11/2016   IR CM INJ ANY COLONIC TUBE W/FLUORO 05/11/2016 [facility]  . PROCTOSCOPY N/A 06/07/2017   Procedure: RIGID PROCTOSCOPY;  Surgeon: Mickeal Skinner, MD;  Location: [facility];  Service: General;  Laterality: N/A;  . TUBAL LIGATION      ROS: Review of Systems Negative except for stated above PHYSICAL  EXAM: BP 118/87   Pulse (!) 109   Temp 98.3 F (36.8 C) (Oral)   Resp 16   Wt 204 lb 3.2 oz (92.6 kg)   SpO2 100%   BMI 33.98 kg/m   Wt Readings from Last 3 Encounters:  06/19/17 204 lb 3.2 oz (92.6 kg)  06/07/17 204 lb (92.5 kg)  05/30/17 204 lb 12.8 oz (92.9 kg)    Physical Exam  General appearance - alert, well appearing, and in no distress Mental status - alert, oriented to person, place, and time, normal mood, behavior, speech, dress, motor activity, and thought processes Chest - clear to auscultation, no wheezes, rales or rhonchi, symmetric air entry Heart - normal rate, regular rhythm, normal S1, S2, no murmurs, rubs, clicks or gallops Abdomen - normal bowel sounds. Midline surgical wound below the umbilicus and 3 other smaller volumes appear to be healing well. No erythema Extremities - peripheral pulses normal, no pedal edema, no clubbing or cyanosis    ASSESSMENT AND PLAN: 1. Essential hypertension At goal. Continue Cozaar  2. Obesity (BMI 30-39.9) -Advised patient to wait until general surgeon release her to regular activities before starting exercise program using her elliptical. Dietary counseling given. Printed information also given  3. Need for Tdap vaccination Given today  4. S/P laparoscopic-assisted sigmoidectomy - CBC With Differential  5. Hx of diverticulitis of colon    Patient was given the opportunity to ask questions.  Patient verbalized understanding of the plan and was able to repeat key elements of the plan.   Orders Placed This Encounter  Procedures  . Tdap vaccine greater than or equal to 7yo IM  . CBC With Differential     Requested Prescriptions    No prescriptions requested or ordered in this encounter    Return in about 4 months (around 10/20/2017).  Karle Plumber, MD, FACP

## 2017-06-20 ENCOUNTER — Telehealth: Payer: Self-pay | Admitting: Internal Medicine

## 2017-06-20 MED ORDER — LOSARTAN POTASSIUM 25 MG PO TABS: 25.0000 mg | ORAL_TABLET | Freq: Every day | ORAL | 1 refills | Status: DC

## 2017-06-20 NOTE — Telephone Encounter (Signed)
Refilled

## 2017-06-20 NOTE — Addendum Note (Signed)
Addended by: Rica Mast on: 06/20/2017 01:40 PM   Modules accepted: Orders

## 2017-06-20 NOTE — Telephone Encounter (Signed)
Patient needs blood pressures medication Cozaar 25 mg

## 2017-06-28 ENCOUNTER — Ambulatory Visit: Payer: Medicare Other | Attending: Internal Medicine

## 2017-06-28 DIAGNOSIS — Z9049 Acquired absence of other specified parts of digestive tract: Secondary | ICD-10-CM | POA: Diagnosis not present

## 2017-06-28 DIAGNOSIS — Z09 Encounter for follow-up examination after completed treatment for conditions other than malignant neoplasm: Secondary | ICD-10-CM | POA: Diagnosis not present

## 2017-06-28 NOTE — Progress Notes (Signed)
Patient here for lab visit only 

## 2017-06-29 LAB — CBC WITH DIFFERENTIAL
Basophils Absolute: 0 10*3/uL (ref 0.0–0.2)
Basos: 0 %
EOS (ABSOLUTE): 0.2 10*3/uL (ref 0.0–0.4)
Eos: 1 %
Hematocrit: 40.5 % (ref 34.0–46.6)
Hemoglobin: 13.8 g/dL (ref 11.1–15.9)
IMMATURE GRANULOCYTES: 0 %
Immature Grans (Abs): 0 10*3/uL (ref 0.0–0.1)
Lymphocytes Absolute: 4.2 10*3/uL — ABNORMAL HIGH (ref 0.7–3.1)
Lymphs: 35 %
MCH: 32.5 pg (ref 26.6–33.0)
MCHC: 34.1 g/dL (ref 31.5–35.7)
MCV: 96 fL (ref 79–97)
MONOS ABS: 0.6 10*3/uL (ref 0.1–0.9)
Monocytes: 5 %
NEUTROS PCT: 59 %
Neutrophils Absolute: 6.9 10*3/uL (ref 1.4–7.0)
RBC: 4.24 x10E6/uL (ref 3.77–5.28)
RDW: 13.2 % (ref 12.3–15.4)
WBC: 11.9 10*3/uL — AB (ref 3.4–10.8)

## 2017-10-18 ENCOUNTER — Encounter: Payer: Self-pay | Admitting: Internal Medicine

## 2017-10-22 ENCOUNTER — Encounter: Payer: Self-pay | Admitting: Internal Medicine

## 2017-10-22 ENCOUNTER — Ambulatory Visit: Payer: Medicare Other | Attending: Internal Medicine | Admitting: Internal Medicine

## 2017-10-22 VITALS — BP 110/78 | HR 96 | Temp 98.2°F | Resp 16 | Ht 65.0 in | Wt 216.0 lb

## 2017-10-22 DIAGNOSIS — I10 Essential (primary) hypertension: Secondary | ICD-10-CM | POA: Diagnosis not present

## 2017-10-22 DIAGNOSIS — Z881 Allergy status to other antibiotic agents status: Secondary | ICD-10-CM | POA: Diagnosis not present

## 2017-10-22 DIAGNOSIS — Z131 Encounter for screening for diabetes mellitus: Secondary | ICD-10-CM

## 2017-10-22 DIAGNOSIS — I8391 Asymptomatic varicose veins of right lower extremity: Secondary | ICD-10-CM | POA: Diagnosis not present

## 2017-10-22 DIAGNOSIS — I83811 Varicose veins of right lower extremities with pain: Secondary | ICD-10-CM

## 2017-10-22 DIAGNOSIS — Z87891 Personal history of nicotine dependence: Secondary | ICD-10-CM | POA: Diagnosis not present

## 2017-10-22 DIAGNOSIS — K5792 Diverticulitis of intestine, part unspecified, without perforation or abscess without bleeding: Secondary | ICD-10-CM | POA: Diagnosis not present

## 2017-10-22 DIAGNOSIS — E669 Obesity, unspecified: Secondary | ICD-10-CM | POA: Diagnosis not present

## 2017-10-22 DIAGNOSIS — L309 Dermatitis, unspecified: Secondary | ICD-10-CM

## 2017-10-22 DIAGNOSIS — R102 Pelvic and perineal pain: Secondary | ICD-10-CM | POA: Insufficient documentation

## 2017-10-22 DIAGNOSIS — Z6830 Body mass index (BMI) 30.0-30.9, adult: Secondary | ICD-10-CM | POA: Insufficient documentation

## 2017-10-22 DIAGNOSIS — Z880 Allergy status to penicillin: Secondary | ICD-10-CM | POA: Diagnosis not present

## 2017-10-22 DIAGNOSIS — Z79899 Other long term (current) drug therapy: Secondary | ICD-10-CM | POA: Insufficient documentation

## 2017-10-22 DIAGNOSIS — D259 Leiomyoma of uterus, unspecified: Secondary | ICD-10-CM | POA: Insufficient documentation

## 2017-10-22 DIAGNOSIS — K5900 Constipation, unspecified: Secondary | ICD-10-CM | POA: Insufficient documentation

## 2017-10-22 LAB — POCT GLYCOSYLATED HEMOGLOBIN (HGB A1C): HEMOGLOBIN A1C: 5.6

## 2017-10-22 MED ORDER — TRIAMCINOLONE ACETONIDE 0.1 % EX CREA
1.0000 | TOPICAL_CREAM | Freq: Two times a day (BID) | CUTANEOUS | 0 refills | Status: DC
Start: 2017-10-22 — End: 2018-06-20

## 2017-10-22 NOTE — Patient Instructions (Addendum)
Follow a Healthy Eating Plan - You can do it! Limit sugary drinks.  Avoid sodas, sweet tea, sport or energy drinks, or fruit drinks.  Drink water, lo-fat milk, or diet drinks. Limit snack foods.   Cut back on candy, cake, cookies, chips, ice cream.  These are a special treat, only in small amounts. Eat plenty of vegetables.  Especially dark green, red, and orange vegetables. Aim for at least 3 servings a day. More is better! Include fruit in your daily diet.  Whole fruit is much healthier than fruit juice! Limit "white" bread, "white" pasta, "white" rice.   Choose "100% whole grain" products, brown or wild rice. Avoid fatty meats. Try "Meatless Monday" and choose eggs or beans one day a week.  When eating meat, choose lean meats like chicken, [place], and fish.  Grill, broil, or bake meats instead of frying, and eat poultry without the skin. Eat less salt.  Avoid frozen pizzas, frozen dinners and salty foods.  Use seasonings other than salt in cooking.  This can help blood pressure and keep you from swelling Beer, wine and liquor have calories.  If you can safely drink alcohol, limit to 1 drink per day for women, 2 drinks for men   Preventing Unhealthy Weight Gain, Adult Staying at a healthy weight is important. When fat builds up in your body, you may become overweight or obese. These conditions put you at greater risk for developing certain health problems, such as heart disease, diabetes, sleeping problems, joint problems, and some cancers. Unhealthy weight gain is often the result of making unhealthy choices in what you eat. It is also a result of not getting enough exercise. You can make changes to your lifestyle to prevent obesity and stay as healthy as possible. What nutrition changes can be made? To maintain a healthy weight and prevent obesity:  Eat only as much as your body needs. To do this: ? Pay attention to signs that you are hungry or full. Stop eating as soon as you feel full. ? If  you feel hungry, try drinking water first. Drink enough water so your urine is clear or pale yellow. ? Eat smaller portions. ? Look at serving sizes on food labels. Most foods contain more than one serving per container. ? Eat the recommended amount of calories for your gender and activity level. While most active people should eat around 2,000 calories per day, if you are trying to lose weight or are not very active, you main need to eat less calories. Talk to your health care provider or dietitian about how many calories you should eat each day.  Choose healthy foods, such as: ? Fruits and vegetables. Try to fill at least half of your plate at each meal with fruits and vegetables. ? Whole grains, such as whole wheat bread, brown rice, and quinoa. ? Lean meats, such as chicken or fish. ? Other healthy proteins, such as beans, eggs, or tofu. ? Healthy fats, such as nuts, seeds, fatty fish, and olive oil. ? Low-fat or fat-free dairy.  Check food labels and avoid food and drinks that: ? Are high in calories. ? Have added sugar. ? Are high in sodium. ? Have saturated fats or trans fats.  Limit how much you eat of the following foods: ? Prepackaged meals. ? Fast food. ? Fried foods. ? Processed meat, such as bacon, sausage, and deli meats. ? Fatty cuts of red meat and poultry with skin.  Cook foods in healthier ways, such  as by baking, broiling, or grilling.  When grocery shopping, try to shop around the outside of the store. This helps you buy mostly fresh foods and avoid canned and prepackaged foods.  What lifestyle changes can be made?  Exercise at least 30 minutes 5 or more days each week. Exercising includes brisk walking, yard work, biking, running, swimming, and team sports like basketball and soccer. Ask your health care provider which exercises are safe for you.  Do not use any products that contain nicotine or tobacco, such as cigarettes and e-cigarettes. If you need help  quitting, ask your health care provider.  Limit alcohol intake to no more than 1 drink a day for nonpregnant women and 2 drinks a day for men. One drink equals 12 oz of beer, 5 oz of wine, or 1 oz of hard liquor.  Try to get 7-9 hours of sleep each night. What other changes can be made?  Keep a food and activity journal to keep track of: ? What you ate and how many calories you had. Remember to count sauces, dressings, and side dishes. ? Whether you were active, and what exercises you did. ? Your calorie, weight, and activity goals.  Check your weight regularly. Track any changes. If you notice you have gained weight, make changes to your diet or activity routine.  Avoid taking weight-loss medicines or supplements. Talk to your health care provider before starting any new medicine or supplement.  Talk to your health care provider before trying any new diet or exercise plan. Why are these changes important? Eating healthy, staying active, and having healthy habits not only help prevent obesity, they also:  Help you to manage stress and emotions.  Help you to connect with friends and family.  Improve your self-esteem.  Improve your sleep.  Prevent long-term health problems.  What can happen if changes are not made? Being obese or overweight can cause you to develop joint or bone problems, which can make it hard for you to stay active or do activities you enjoy. Being obese or overweight also puts stress on your heart and lungs and can lead to health problems like diabetes, heart disease, and some cancers. Where to find more information: Talk with your health care provider or a dietitian about healthy eating and healthy lifestyle choices. You may also find other information through these resources:  [place]. Department of Agriculture MyPlate: FormerBoss.no  American Heart Association: www.heart.org  Centers for Disease Control and Prevention: http://www.wolf.info/  Summary  Staying  at a healthy weight is important. It helps prevent certain diseases and health problems, such as heart disease, diabetes, joint problems, sleep disorders, and some cancers.  Being obese or overweight can cause you to develop joint or bone problems, which can make it hard for you to stay active or do activities you enjoy.  You can prevent unhealthy weight gain by eating a healthy diet, exercising regularly, not smoking, limiting alcohol, and getting enough sleep.  Talk with your health care provider or a dietitian for guidance about healthy eating and healthy lifestyle choices. This information is not intended to replace advice given to you by your health care provider. Make sure you discuss any questions you have with your health care provider. Document Released: 08/15/2016 Document Revised: 09/20/2016 Document Reviewed: 09/20/2016 Elsevier Interactive Patient Education  [employer].

## 2017-10-22 NOTE — Progress Notes (Signed)
Patient ID: Jasmine Cobb, female    DOB: 02/10/1966  MRN: [id number]  CC: Hypertension   Subjective: Jasmine Cobb is a 52 y.o. female who presents for chronic ds management. Her concerns today include:  Hx of HTN, former tobacco, fibroid, diverticulitis.  1.  Obesity:  Gain 12 lbs since last visit.  Admits that she has been eating a lot more and moving less.  Eating more fried foods and larger portion sizes -She also used to walk a lot to and from the bus stop for several months but just got car fix.  Walking last -Requests screening for diabetes as it does run in her family  2.  Varicose vein: has varicose vein RT upper inner thigh which she has had for yrs. Causes some discomfort with touch and walking. Shoots sharp pain up and down the thigh.  No swelling.  3.  Has eczema on RT ankle, LT elbow and face esp around eyes. Flares up intermittently.  Face itches if she does not use moisturizing cream -also notice black spots behind ears and ankles for years.  Inc in number.  -uses Grape Seed oil on her skin -flares with dairy products  4.  HTN: compliant with Cozaar.  Limits salt in foods  Patient Active Problem List   Diagnosis Date Noted  . Immunization due 06/19/2017  . Diverticulitis, colon 06/07/2017  . Uterine leiomyoma 01/18/2017  . Former smoker 01/18/2017  . Pelvic pain   . Essential hypertension 01/06/2017  . Diverticulitis of large intestine with abscess 01/06/2017  . Diverticulitis of large intestine with abscess without bleeding 05/05/2016     Current Outpatient Medications on File Prior to Visit  Medication Sig Dispense Refill  . b complex vitamins tablet Take 1 tablet by mouth daily.    Marland Kitchen losartan (COZAAR) 25 MG tablet Take 1 tablet (25 mg total) by mouth daily. 90 tablet 1  . vitamin C (ASCORBIC ACID) 500 MG tablet Take 500 mg by mouth daily.    Marland Kitchen VITAMIN E PO Take 1 capsule by mouth daily.    Marland Kitchen lactobacillus acidophilus & bulgar (LACTINEX) chewable  tablet Chew 1 tablet by mouth 3 (three) times daily with meals. (Patient not taking: Reported on 01/18/2017) 10 tablet 0  . polyethylene glycol (MIRALAX / GLYCOLAX) packet Take 17 g by mouth daily as needed for mild constipation.     No current facility-administered medications on file prior to visit.     Allergies  Allergen Reactions  . Lisinopril Nausea Only and Other (See Comments)    Feels sick  . Shellfish Allergy Anaphylaxis  . Ciprofloxacin Swelling  . Penicillins Other (See Comments)    unknown    Social History   Socioeconomic History  . Marital status: Divorced    Spouse name: Not on file  . Number of children: Not on file  . Years of education: Not on file  . Highest education level: Not on file  Social Needs  . Financial resource strain: Not on file  . Food insecurity - worry: Not on file  . Food insecurity - inability: Not on file  . Transportation needs - medical: Not on file  . Transportation needs - non-medical: Not on file  Occupational History  . Not on file  Tobacco Use  . Smoking status: Former Smoker    Types: Cigarettes  . Smokeless tobacco: Former Network engineer and Sexual Activity  . Alcohol use: Yes    Comment: occ  . Drug use: Yes  Types: Marijuana    Comment: occasioanally   . Sexual activity: Not Currently    Partners: Male  Other Topics Concern  . Not on file  Social History Narrative  . Not on file    Family History  Problem Relation Age of Onset  . Breast cancer Maternal Grandmother     Past Surgical History:  Procedure Laterality Date  . COLON RESECTION N/A 06/07/2017   Procedure: LAPAROSCOPIC SIGMOID COLON RESECTION WITH SMALL BOWEL REPAIR;  Surgeon: Kinsinger, Arta Bruce, MD;  Location: [facility];  Service: General;  Laterality: N/A;  . CYSTOSCOPY WITH STENT PLACEMENT Bilateral 06/07/2017   Procedure: CYSTOSCOPY WITH STENT PLACEMENT;  Surgeon: Irine Seal, MD;  Location: [facility];  Service: Urology;  Laterality: Bilateral;  .  IR GENERIC HISTORICAL  05/11/2016   IR CM INJ ANY COLONIC TUBE W/FLUORO 05/11/2016 [facility]  . PROCTOSCOPY N/A 06/07/2017   Procedure: RIGID PROCTOSCOPY;  Surgeon: Mickeal Skinner, MD;  Location: [facility];  Service: General;  Laterality: N/A;  . TUBAL LIGATION      ROS: Review of Systems Negative except as stated above PHYSICAL EXAM: BP 110/78   Pulse 96   Temp 98.2 F (36.8 C) (Oral)   Resp 16   Ht 5\' 5"  (1.651 m)   Wt 216 lb (98 kg)   SpO2 99%   BMI 35.94 kg/m   Wt Readings from Last 3 Encounters:  10/22/17 216 lb (98 kg)  06/19/17 204 lb 3.2 oz (92.6 kg)  06/07/17 204 lb (92.5 kg)    Physical Exam General appearance - alert, well appearing, and in no distress Mental status - alert, oriented to person, place, and time, normal mood, behavior, speech, dress, motor activity, and thought processes Neck - supple, no significant adenopathy Chest - clear to auscultation, no wheezes, rales or rhonchi, symmetric air entry Heart - normal rate, regular rhythm, normal S1, S2, no murmurs, rubs, clicks or gallops Extremities - peripheral pulses normal, no pedal edema, no clubbing or cyanosis Skin -few scattered inflamed spots over her forehead.  Hyperpigmentation of skin around the eyes.  Dry erythematous scaly patch measuring about 6 cm in greatest diameter on right lower shin.  Smaller patch on left elbow -1.5 cm small spider vein RT upper inner thigh.  Nontender to touch and does not appear to be inflammed. Results for orders placed or performed in visit on 10/22/17  POCT glycosylated hemoglobin (Hb A1C)  Result Value Ref Range   Hemoglobin A1C 5.6     ASSESSMENT AND PLAN: 1. Obesity (BMI 30-39.9) Discussed healthy eating habits.  Printed information given. Encouraged regular aerobic exercise at least 3-4 times a week for 30 minutes.  2. Essential hypertension At goal.  Continue Cozaar 3. Varicose veins of right lower extremity with pain Very small and does not appears  to be inflamed at this time.  Patient given the option of monitoring versus seeing a vascular/vein specialist.  She opted to monitor.  4. Eczema, unspecified type - Ambulatory referral to Dermatology - triamcinolone cream (KENALOG) 0.1 %; Apply 1 application topically 2 (two) times daily.  Dispense: 45 g; Refill: 0 Advised not to use the triamcinolone cream on her face for more than 2 days as it can cause thinning of the skin 5. Dermatitis - Ambulatory referral to Dermatology  6. Diabetes mellitus screening - POCT glycosylated hemoglobin (Hb A1C)  Patient was given the opportunity to ask questions.  Patient verbalized understanding of the plan and was able to repeat key  elements of the plan.   Orders Placed This Encounter  Procedures  . Ambulatory referral to Dermatology  . POCT glycosylated hemoglobin (Hb A1C)     Requested Prescriptions   Signed Prescriptions Disp Refills  . triamcinolone cream (KENALOG) 0.1 % 45 g 0    Sig: Apply 1 application topically 2 (two) times daily.    Return in about 4 months (around 02/19/2018).  Karle Plumber, MD, FACP

## 2017-10-23 DIAGNOSIS — L309 Dermatitis, unspecified: Secondary | ICD-10-CM | POA: Insufficient documentation

## 2017-11-01 ENCOUNTER — Other Ambulatory Visit: Payer: Self-pay | Admitting: Internal Medicine

## 2017-12-25 ENCOUNTER — Telehealth: Payer: Self-pay | Admitting: Internal Medicine

## 2017-12-25 MED ORDER — ALBUTEROL SULFATE HFA 108 (90 BASE) MCG/ACT IN AERS
2.0000 | INHALATION_SPRAY | Freq: Four times a day (QID) | RESPIRATORY_TRACT | 0 refills | Status: DC | PRN
Start: 2017-12-25 — End: 2018-06-20

## 2017-12-25 NOTE — Telephone Encounter (Signed)
Patient called requesting to know the status of her Dermatology referral. Patient has Medicare and Medicaid. Please f/u

## 2017-12-25 NOTE — Telephone Encounter (Signed)
Sorry , no done Yet but I'm doing it right now . Sent Referral to  [facility] will call her to schedule an appointment [place] . Thank you

## 2017-12-25 NOTE — Telephone Encounter (Signed)
Will forward to PCP   Jasmine Cobb would you be able to let me know about pt referral

## 2017-12-25 NOTE — Telephone Encounter (Signed)
Patient called stating that she needs an Rx for albuterol. Patient states that she is coughing a lot. Told patient that I did not see any medication on her chart and patient stated that her PCP did not write an Rx b/c she did not have a history of Asthma. Please f/u with pt.

## 2018-01-18 DIAGNOSIS — H1013 Acute atopic conjunctivitis, bilateral: Secondary | ICD-10-CM | POA: Diagnosis not present

## 2018-01-18 DIAGNOSIS — H43393 Other vitreous opacities, bilateral: Secondary | ICD-10-CM | POA: Diagnosis not present

## 2018-01-18 DIAGNOSIS — H1010 Acute atopic conjunctivitis, unspecified eye: Secondary | ICD-10-CM | POA: Diagnosis not present

## 2018-01-18 DIAGNOSIS — H524 Presbyopia: Secondary | ICD-10-CM | POA: Diagnosis not present

## 2018-01-23 DIAGNOSIS — L729 Follicular cyst of the skin and subcutaneous tissue, unspecified: Secondary | ICD-10-CM | POA: Diagnosis not present

## 2018-01-23 DIAGNOSIS — L209 Atopic dermatitis, unspecified: Secondary | ICD-10-CM | POA: Diagnosis not present

## 2018-01-23 DIAGNOSIS — L239 Allergic contact dermatitis, unspecified cause: Secondary | ICD-10-CM | POA: Diagnosis not present

## 2018-02-01 ENCOUNTER — Other Ambulatory Visit: Payer: Self-pay | Admitting: Internal Medicine

## 2018-02-18 DIAGNOSIS — I10 Essential (primary) hypertension: Secondary | ICD-10-CM | POA: Diagnosis not present

## 2018-02-18 DIAGNOSIS — R5383 Other fatigue: Secondary | ICD-10-CM | POA: Diagnosis not present

## 2018-02-18 DIAGNOSIS — M2550 Pain in unspecified joint: Secondary | ICD-10-CM | POA: Diagnosis not present

## 2018-04-02 ENCOUNTER — Other Ambulatory Visit: Payer: Self-pay | Admitting: Internal Medicine

## 2018-04-02 DIAGNOSIS — Z1231 Encounter for screening mammogram for malignant neoplasm of breast: Secondary | ICD-10-CM

## 2018-04-16 DIAGNOSIS — Z9049 Acquired absence of other specified parts of digestive tract: Secondary | ICD-10-CM | POA: Diagnosis not present

## 2018-04-16 DIAGNOSIS — Z8719 Personal history of other diseases of the digestive system: Secondary | ICD-10-CM | POA: Diagnosis not present

## 2018-04-16 DIAGNOSIS — K6289 Other specified diseases of anus and rectum: Secondary | ICD-10-CM | POA: Diagnosis not present

## 2018-04-26 ENCOUNTER — Ambulatory Visit
Admission: RE | Admit: 2018-04-26 | Discharge: 2018-04-26 | Disposition: A | Discharge status: Home / Self Care | Payer: Medicare Other | Source: Ambulatory Visit | Attending: Internal Medicine | Admitting: Internal Medicine

## 2018-04-26 DIAGNOSIS — Z1231 Encounter for screening mammogram for malignant neoplasm of breast: Secondary | ICD-10-CM | POA: Diagnosis not present

## 2018-05-09 ENCOUNTER — Encounter: Payer: Self-pay | Admitting: Internal Medicine

## 2018-05-09 ENCOUNTER — Ambulatory Visit (HOSPITAL_BASED_OUTPATIENT_CLINIC_OR_DEPARTMENT_OTHER): Payer: Medicare Other | Admitting: Licensed Clinical Social Worker

## 2018-05-09 ENCOUNTER — Ambulatory Visit: Payer: Medicare Other | Attending: Internal Medicine | Admitting: Internal Medicine

## 2018-05-09 VITALS — BP 164/97 | HR 75 | Temp 98.7°F | Resp 16 | Wt 214.2 lb

## 2018-05-09 DIAGNOSIS — J45909 Unspecified asthma, uncomplicated: Secondary | ICD-10-CM | POA: Insufficient documentation

## 2018-05-09 DIAGNOSIS — Z79899 Other long term (current) drug therapy: Secondary | ICD-10-CM | POA: Insufficient documentation

## 2018-05-09 DIAGNOSIS — Z87891 Personal history of nicotine dependence: Secondary | ICD-10-CM | POA: Insufficient documentation

## 2018-05-09 DIAGNOSIS — R053 Chronic cough: Secondary | ICD-10-CM

## 2018-05-09 DIAGNOSIS — K219 Gastro-esophageal reflux disease without esophagitis: Secondary | ICD-10-CM | POA: Diagnosis not present

## 2018-05-09 DIAGNOSIS — R05 Cough: Secondary | ICD-10-CM

## 2018-05-09 DIAGNOSIS — E669 Obesity, unspecified: Secondary | ICD-10-CM | POA: Diagnosis not present

## 2018-05-09 DIAGNOSIS — F331 Major depressive disorder, recurrent, moderate: Secondary | ICD-10-CM

## 2018-05-09 DIAGNOSIS — H538 Other visual disturbances: Secondary | ICD-10-CM | POA: Diagnosis not present

## 2018-05-09 DIAGNOSIS — F329 Major depressive disorder, single episode, unspecified: Secondary | ICD-10-CM | POA: Insufficient documentation

## 2018-05-09 DIAGNOSIS — L02224 Furuncle of groin: Secondary | ICD-10-CM

## 2018-05-09 DIAGNOSIS — I10 Essential (primary) hypertension: Secondary | ICD-10-CM | POA: Diagnosis not present

## 2018-05-09 DIAGNOSIS — Z6835 Body mass index (BMI) 35.0-35.9, adult: Secondary | ICD-10-CM | POA: Insufficient documentation

## 2018-05-09 DIAGNOSIS — F419 Anxiety disorder, unspecified: Secondary | ICD-10-CM

## 2018-05-09 DIAGNOSIS — F4321 Adjustment disorder with depressed mood: Secondary | ICD-10-CM

## 2018-05-09 MED ORDER — OMEPRAZOLE 20 MG PO CPDR: 20.0000 mg | DELAYED_RELEASE_CAPSULE | Freq: Every day | ORAL | 3 refills | Status: DC

## 2018-05-09 MED ORDER — AMLODIPINE BESYLATE 5 MG PO TABS: 5.0000 mg | ORAL_TABLET | Freq: Every day | ORAL | 3 refills | Status: DC

## 2018-05-09 MED ORDER — BUDESONIDE-FORMOTEROL FUMARATE 80-4.5 MCG/ACT IN AERO: 2.0000 | INHALATION_SPRAY | Freq: Two times a day (BID) | RESPIRATORY_TRACT | 3 refills | Status: AC

## 2018-05-09 NOTE — Progress Notes (Signed)
Patient ID: Jasmine Cobb, female    DOB: 1965/10/08  MRN: [id number]  CC: Hypertension and Depression   Subjective: Jasmine Cobb is a 52 y.o. female who presents for chronic ds managementt Her concerns today include:  Hx of HTN, former tobacco, fibroid, diverticulitis.  Patient complains of chronic cough that started around March of this year.  She thinks she may have had pneumonia and flareup of asthma at that time.  Cough at that time was productive of pinkish phlegm.  She requested refill on albuterol. -Since then, cough has persisted.  It is sometimes productive but not discolored.  Cough is associated with a lot of aching pain around the left collarbone.  Note intermittent wheezing sometimes at nights.  She has been tobacco free since 2015. -Denies any drainage at the back of the throat. -Endorses heartburn symptoms. -She is very anxious and worried.  Reports being told by her previous PCP at [facility] that she had some type of gene mutation that could predispose her to developing lung cancer.  HTN: Reports compliance with Cozaar and salt restriction.  Obesity: States that she is working very hard with trying to eat healthy.  Depression screen was positive.  Reported to my CMA that August was the month of her mother's death and she never really grieved.  Patient Active Problem List   Diagnosis Date Noted  . Eczema 10/23/2017  . Immunization due 06/19/2017  . Diverticulitis, colon 06/07/2017  . Uterine leiomyoma 01/18/2017  . Former smoker 01/18/2017  . Pelvic pain   . Essential hypertension 01/06/2017  . Diverticulitis of large intestine with abscess 01/06/2017  . Diverticulitis of large intestine with abscess without bleeding 05/05/2016     Current Outpatient Medications on File Prior to Visit  Medication Sig Dispense Refill  . albuterol (PROVENTIL HFA;VENTOLIN HFA) 108 (90 Base) MCG/ACT inhaler Inhale 2 puffs into the lungs every 6 (six) hours as needed  for wheezing or shortness of breath. 1 Inhaler 0  . b complex vitamins tablet Take 1 tablet by mouth daily.    Marland Kitchen lactobacillus acidophilus & bulgar (LACTINEX) chewable tablet Chew 1 tablet by mouth 3 (three) times daily with meals. (Patient not taking: Reported on 01/18/2017) 10 tablet 0  . losartan (COZAAR) 25 MG tablet take 1 tablet by mouth once daily 90 tablet 0  . polyethylene glycol (MIRALAX / GLYCOLAX) packet Take 17 g by mouth daily as needed for mild constipation.    . triamcinolone cream (KENALOG) 0.1 % Apply 1 application topically 2 (two) times daily. 45 g 0  . vitamin C (ASCORBIC ACID) 500 MG tablet Take 500 mg by mouth daily.    Marland Kitchen VITAMIN E PO Take 1 capsule by mouth daily.     No current facility-administered medications on file prior to visit.     Allergies  Allergen Reactions  . Lisinopril Nausea Only and Other (See Comments)    Feels sick  . Shellfish Allergy Anaphylaxis  . Ciprofloxacin Swelling  . Penicillins Other (See Comments)    unknown    Social History   Socioeconomic History  . Marital status: Divorced    Spouse name: Not on file  . Number of children: Not on file  . Years of education: Not on file  . Highest education level: Not on file  Occupational History  . Not on file  Social Needs  . Financial resource strain: Not on file  . Food insecurity:    Worry: Not on file  Inability: Not on file  . Transportation needs:    Medical: Not on file    Non-medical: Not on file  Tobacco Use  . Smoking status: Former Smoker    Types: Cigarettes  . Smokeless tobacco: Former Network engineer and Sexual Activity  . Alcohol use: Yes    Comment: occ  . Drug use: Yes    Types: Marijuana    Comment: occasioanally   . Sexual activity: Not Currently    Partners: Male  Lifestyle  . Physical activity:    Days per week: Not on file    Minutes per session: Not on file  . Stress: Not on file  Relationships  . Social connections:    Talks on phone: Not on  file    Gets together: Not on file    Attends religious service: Not on file    Active member of club or organization: Not on file    Attends meetings of clubs or organizations: Not on file    Relationship status: Not on file  . Intimate partner violence:    Fear of current or ex partner: Not on file    Emotionally abused: Not on file    Physically abused: Not on file    Forced sexual activity: Not on file  Other Topics Concern  . Not on file  Social History Narrative  . Not on file    Family History  Problem Relation Age of Onset  . Breast cancer Maternal Grandmother     Past Surgical History:  Procedure Laterality Date  . COLON RESECTION N/A 06/07/2017   Procedure: LAPAROSCOPIC SIGMOID COLON RESECTION WITH SMALL BOWEL REPAIR;  Surgeon: Kinsinger, Arta Bruce, MD;  Location: [facility];  Service: General;  Laterality: N/A;  . CYSTOSCOPY WITH STENT PLACEMENT Bilateral 06/07/2017   Procedure: CYSTOSCOPY WITH STENT PLACEMENT;  Surgeon: Irine Seal, MD;  Location: [facility];  Service: Urology;  Laterality: Bilateral;  . IR GENERIC HISTORICAL  05/11/2016   IR CM INJ ANY COLONIC TUBE W/FLUORO 05/11/2016 [facility]  . PROCTOSCOPY N/A 06/07/2017   Procedure: RIGID PROCTOSCOPY;  Surgeon: Mickeal Skinner, MD;  Location: [facility];  Service: General;  Laterality: N/A;  . TUBAL LIGATION      ROS: Review of Systems Complains of recurrent small boils sometimes under the abdominal fold other times around the panty line.  Reportedly has one today in the right groin that she busted.  PHYSICAL EXAM: BP (!) 164/97   Pulse 75   Temp 98.7 F (37.1 C) (Oral)   Resp 16   Wt 214 lb 3.2 oz (97.2 kg)   SpO2 99%   BMI 35.64 kg/m    Wt Readings from Last 3 Encounters:  05/09/18 214 lb 3.2 oz (97.2 kg)  10/22/17 216 lb (98 kg)  06/19/17 204 lb 3.2 oz (92.6 kg)    Physical Exam  General appearance - alert, well appearing, and in no distress Mental status -patient very talkative and  anxious. Nose - normal and patent, no erythema, discharge or polyps Mouth - mucous membranes moist, pharynx normal without lesions Neck - supple, no cervical lymphadenopathy.  No thyroid enlargement. Chest - clear to auscultation, no wheezes, rales or rhonchi, symmetric air entry.  Mild fullness of the fat pad above the collarbone on each side left a little bit more than right. Heart - normal rate, regular rhythm, normal S1, S2, no murmurs, rubs, clicks or gallops Abdomen - soft, nontender, nondistended, no masses or organomegaly Extremities - peripheral  pulses normal, no pedal edema, no clubbing or cyanosis MSK: No tenderness on palpation along the left collarbone.  Depression screen [facility] 2/9 05/09/2018 10/22/2017 06/19/2017  Decreased Interest 1 0 0  Down, Depressed, Hopeless 2 0 0  PHQ - 2 Score 3 0 0  Altered sleeping 2 - -  Tired, decreased energy 2 - -  Change in appetite 2 - -  Feeling bad or failure about yourself  3 - -  Trouble concentrating 1 - -  Moving slowly or fidgety/restless 1 - -  Suicidal thoughts 0 - -  PHQ-9 Score 14 - -     ASSESSMENT AND PLAN: 1. Chronic cough We will need to evaluate this further.  I have ordered a chest x-ray and pulmonary function test.  She does have a component of GERD so we will start on low-dose of omeprazole.  She will continue albuterol as needed.  Symbicort added. - DG Chest 2 View; Future - budesonide-formoterol (SYMBICORT) 80-4.5 MCG/ACT inhaler; Inhale 2 puffs into the lungs 2 (two) times daily.  Dispense: 1 Inhaler; Refill: 3 - omeprazole (PRILOSEC) 20 MG capsule; Take 1 capsule (20 mg total) by mouth daily.  Dispense: 30 capsule; Refill: 3 - Pulmonary function test; Future  2. Essential hypertension Not at goal.  Add amlodipine. - amLODipine (NORVASC) 5 MG tablet; Take 1 tablet (5 mg total) by mouth daily.  Dispense: 90 tablet; Refill: 3 - Comprehensive metabolic panel - CBC  3. Boil, groin Likely due to MRSA.  The boil that  she refers to that was in the right groin has resolved.  There is no fluctuance, erythema or drainage.  No need for antibiotics at this time.  4. Blurred vision Refer for routine eye exam.  Patient was given the opportunity to ask questions.  Patient verbalized understanding of the plan and was able to repeat key elements of the plan.   Orders Placed This Encounter  Procedures  . DG Chest 2 View  . Comprehensive metabolic panel  . CBC  . Specimen status report  . Ambulatory referral to Ophthalmology  . Pulmonary function test     Requested Prescriptions   Signed Prescriptions Disp Refills  . budesonide-formoterol (SYMBICORT) 80-4.5 MCG/ACT inhaler 1 Inhaler 3    Sig: Inhale 2 puffs into the lungs 2 (two) times daily.  Marland Kitchen omeprazole (PRILOSEC) 20 MG capsule 30 capsule 3    Sig: Take 1 capsule (20 mg total) by mouth daily.  Marland Kitchen amLODipine (NORVASC) 5 MG tablet 90 tablet 3    Sig: Take 1 tablet (5 mg total) by mouth daily.    Return in about 6 weeks (around 06/20/2018).  Karle Plumber, MD, FACP

## 2018-05-09 NOTE — Progress Notes (Signed)
Pt states she has a cough all the time and it is worse at night  Jun 07, 2017 she had a procedure and 26 days after her procedure her mother past away.   Pt states she started crying when it was almost time for her mother birthday which was August 21  Pt states she has been physically sick. Pt states it's not her emotions  Pt states she has boils in her groin area   Pt states she is having pain in her left colar bone and her in left lung

## 2018-05-10 LAB — COMPREHENSIVE METABOLIC PANEL
A/G RATIO: 1.4 (ref 1.2–2.2)
ALT: 14 IU/L (ref 0–32)
AST: 15 IU/L (ref 0–40)
Albumin: 4.2 g/dL (ref 3.5–5.5)
Alkaline Phosphatase: 63 IU/L (ref 39–117)
BUN/Creatinine Ratio: 8 — ABNORMAL LOW (ref 9–23)
BUN: 7 mg/dL (ref 6–24)
Bilirubin Total: <0.2 mg/dL (ref 0.0–1.2)
CALCIUM: 9.2 mg/dL (ref 8.7–10.2)
CO2: 20 mmol/L (ref 20–29)
CREATININE: 0.9 mg/dL (ref 0.57–1.00)
Chloride: 104 mmol/L (ref 96–106)
GFR, EST AFRICAN AMERICAN: 84 mL/min/{1.73_m2} (ref >59)
GFR, EST NON AFRICAN AMERICAN: 73 mL/min/{1.73_m2} (ref >59)
Globulin, Total: 3 g/dL (ref 1.5–4.5)
Glucose: 102 mg/dL — ABNORMAL HIGH (ref 65–99)
POTASSIUM: 3.8 mmol/L (ref 3.5–5.2)
Sodium: 140 mmol/L (ref 134–144)
TOTAL PROTEIN: 7.2 g/dL (ref 6.0–8.5)

## 2018-05-10 LAB — CBC
HEMATOCRIT: 38.8 % (ref 34.0–46.6)
Hemoglobin: 13.4 g/dL (ref 11.1–15.9)
MCH: 32.8 pg (ref 26.6–33.0)
MCHC: 34.5 g/dL (ref 31.5–35.7)
MCV: 95 fL (ref 79–97)
Platelets: 261 10*3/uL (ref 150–450)
RBC: 4.08 x10E6/uL (ref 3.77–5.28)
RDW: 12.5 % (ref 12.3–15.4)
WBC: 11.8 10*3/uL — ABNORMAL HIGH (ref 3.4–10.8)

## 2018-05-10 NOTE — BH Specialist Note (Signed)
Integrated Behavioral Health Initial Visit  MRN: [id number] Name: Jasmine Cobb  Number of [place] Clinician visits:: 1/6 Session Start time: 3:30 PM  Session End time: 4:00 PM Total time: 30 minutes  Type of Service: [place] Interpretor:No. Interpretor Name and Language: N/A   Warm Hand Off Completed.       SUBJECTIVE: Jasmine Cobb is a 52 y.o. female accompanied by self Patient was referred by Dr. Wynetta Emery for grief counseling. Patient reports the following symptoms/concerns: Pt is grieving the death of mother 12-04-16) She states that she did not have the opportunity to process last year, due to healing from a surgical procedure. Pt states that she is physically sick and is concerned that she may have an undiagnosed medical conditions Duration of problem: 1 year; Severity of problem: moderate  OBJECTIVE: Mood: Anxious and Affect: Tearful Risk of harm to self or others: No plan to harm self or others  LIFE CONTEXT: Family and Social: Pt has three adult children that reside nearby. She discussed grandchildren being her motivation factors School/Work: Pt receives disability Self-Care: Pt is open to initiating psychotherapy in the future. Denies substance use Life Changes: Pt has ongoing concerns regarding her physical health. She is grieving the loss of her mother.  GOALS ADDRESSED: Patient will: 1. Reduce symptoms of: anxiety 2. Increase knowledge and/or ability of: coping skills and healthy habits  3. Demonstrate ability to: Increase adequate support systems for patient/family and Begin healthy grieving over loss  INTERVENTIONS: Interventions utilized: Mindfulness or Psychologist, educational, Supportive Counseling, Psychoeducation and/or Health Education and Link to [employer]  Standardized Assessments completed: GAD-7 and PHQ 2&9  ASSESSMENT: Patient currently experiencing anxiety and depression triggered  by concerns for medical health and grief. She reports being physically sick and is concerned that she may have an undiagnosed medical conditions. Pt receives support from family and denies SI/HI.   Patient may benefit from psychoeducation and psychotherapy. [place] educated pt on the stages of grief and how stress can negatively impact physical and mental health. Therapeutic strategies were discussed to decrease symptoms. Pt was referred to hospice for grief counseling, in addition, to the [employer] to obtain independent and affordable housing. Pt is currently not interested in behavioral health services, stating that she would like to focus on her physical health at this time.   PLAN: 1. Follow up with behavioral health clinician on : Pt was encouraged to contact [facility] if symptoms worsen or fail to improve to schedule behavioral appointments at [facility]. 2. Behavioral recommendations: [facility] recommends that pt apply healthy coping skills discussed and utilize provided resources. Pt is encouraged to schedule follow up appointment with [facility] 3. Referral(s): Integrated Orthoptist (In Clinic) and [employer] Resources:  Housing and Grief Counseling 4. "From scale of 1-10, how likely are you to follow plan?":   Rebekah [place], LCSW 05/10/18 10:12 AM

## 2018-05-13 ENCOUNTER — Ambulatory Visit (HOSPITAL_COMMUNITY)
Admission: RE | Admit: 2018-05-13 | Discharge: 2018-05-13 | Disposition: A | Discharge status: Home / Self Care | Payer: Medicare Other | Source: Ambulatory Visit | Attending: Internal Medicine | Admitting: Internal Medicine

## 2018-05-13 DIAGNOSIS — R053 Chronic cough: Secondary | ICD-10-CM

## 2018-05-13 DIAGNOSIS — R05 Cough: Secondary | ICD-10-CM

## 2018-05-13 DIAGNOSIS — R079 Chest pain, unspecified: Secondary | ICD-10-CM | POA: Diagnosis not present

## 2018-05-13 DIAGNOSIS — R0602 Shortness of breath: Secondary | ICD-10-CM | POA: Diagnosis not present

## 2018-05-20 ENCOUNTER — Other Ambulatory Visit: Payer: Self-pay

## 2018-05-20 ENCOUNTER — Other Ambulatory Visit: Payer: Self-pay | Admitting: Internal Medicine

## 2018-05-20 MED ORDER — LOSARTAN POTASSIUM 25 MG PO TABS: 25.0000 mg | ORAL_TABLET | Freq: Every day | ORAL | 1 refills | Status: DC

## 2018-05-20 NOTE — Telephone Encounter (Signed)
Patient called to check on her refill request due to her being out of medicine. Please follow up with patient.

## 2018-05-20 NOTE — Telephone Encounter (Signed)
Rx was already sent to patients pharmacy of choice

## 2018-05-23 ENCOUNTER — Ambulatory Visit (HOSPITAL_COMMUNITY)
Admission: RE | Admit: 2018-05-23 | Discharge: 2018-05-23 | Disposition: A | Discharge status: Home / Self Care | Payer: Medicare Other | Source: Ambulatory Visit | Attending: Internal Medicine | Admitting: Internal Medicine

## 2018-05-23 DIAGNOSIS — H2513 Age-related nuclear cataract, bilateral: Secondary | ICD-10-CM | POA: Diagnosis not present

## 2018-05-23 DIAGNOSIS — R05 Cough: Secondary | ICD-10-CM | POA: Insufficient documentation

## 2018-05-23 DIAGNOSIS — H25013 Cortical age-related cataract, bilateral: Secondary | ICD-10-CM | POA: Diagnosis not present

## 2018-05-23 DIAGNOSIS — R053 Chronic cough: Secondary | ICD-10-CM

## 2018-05-23 LAB — PULMONARY FUNCTION TEST
DL/VA % PRED: 84 %
DL/VA: 4.26 ml/min/mmHg/L
DLCO UNC % PRED: 66 %
DLCO cor % pred: 66 %
DLCO cor: 17.99 ml/min/mmHg
DLCO unc: 17.99 ml/min/mmHg
FEF 25-75 PRE: 3.15 L/s
FEF 25-75 Post: 3.25 L/sec
FEF2575-%CHANGE-POST: 2 %
FEF2575-%PRED-PRE: 127 %
FEF2575-%Pred-Post: 131 %
FEV1-%Change-Post: 0 %
FEV1-%PRED-POST: 105 %
FEV1-%Pred-Pre: 106 %
FEV1-POST: 2.57 L
FEV1-Pre: 2.59 L
FEV1FVC-%CHANGE-POST: 1 %
FEV1FVC-%PRED-PRE: 105 %
FEV6-%CHANGE-POST: -1 %
FEV6-%PRED-PRE: 102 %
FEV6-%Pred-Post: 100 %
FEV6-Post: 2.99 L
FEV6-Pre: 3.03 L
FEV6FVC-%Change-Post: 0 %
FEV6FVC-%Pred-Post: 103 %
FEV6FVC-%Pred-Pre: 102 %
FVC-%CHANGE-POST: -1 %
FVC-%Pred-Post: 97 %
FVC-%Pred-Pre: 99 %
FVC-Post: 2.99 L
POST FEV1/FVC RATIO: 86 %
POST FEV6/FVC RATIO: 100 %
PRE FEV1/FVC RATIO: 85 %
PRE FEV6/FVC RATIO: 100 %
RV % pred: 149 %
RV: 2.91 L
TLC % pred: 114 %
TLC: 6.12 L

## 2018-05-23 MED ORDER — ALBUTEROL SULFATE (2.5 MG/3ML) 0.083% IN NEBU
2.5000 mg | INHALATION_SOLUTION | Freq: Once | RESPIRATORY_TRACT | Status: AC
Start: 2018-05-23 — End: 2018-05-23
  Administered 2018-05-23: 2.5 mg via RESPIRATORY_TRACT

## 2018-05-24 LAB — SPECIMEN STATUS REPORT

## 2018-06-01 LAB — WHITE BLOOD COUNT AND DIFFERENTIAL
BASOS ABS: 0 10*3/uL (ref 0.0–0.2)
Basos: 0 %
EOS (ABSOLUTE): 0.2 10*3/uL (ref 0.0–0.4)
Eos: 2 %
IMMATURE GRANS (ABS): 0 10*3/uL (ref 0.0–0.1)
IMMATURE GRANULOCYTES: 0 %
LYMPHS: 32 %
Lymphocytes Absolute: 3.7 10*3/uL — ABNORMAL HIGH (ref 0.7–3.1)
Monocytes Absolute: 0.5 10*3/uL (ref 0.1–0.9)
Monocytes: 5 %
Neutrophils Absolute: 7.3 10*3/uL — ABNORMAL HIGH (ref 1.4–7.0)
Neutrophils: 61 %
WBC: 11.7 10*3/uL — ABNORMAL HIGH (ref 3.4–10.8)

## 2018-06-01 LAB — SPECIMEN STATUS REPORT

## 2018-06-05 ENCOUNTER — Other Ambulatory Visit: Payer: Self-pay | Admitting: Internal Medicine

## 2018-06-05 DIAGNOSIS — R0602 Shortness of breath: Secondary | ICD-10-CM

## 2018-06-06 NOTE — Progress Notes (Signed)
Contacted the scheduling department and spoke with Aniceto Boss   CT is scheduled for October 15 @10am  @[facility]. Pt is to arrive @945am .   Contacted pt and made aware of appointment information

## 2018-06-11 ENCOUNTER — Ambulatory Visit (HOSPITAL_COMMUNITY)
Admission: RE | Admit: 2018-06-11 | Discharge: 2018-06-11 | Disposition: A | Discharge status: Home / Self Care | Payer: Medicare Other | Source: Ambulatory Visit | Attending: Internal Medicine | Admitting: Internal Medicine

## 2018-06-11 DIAGNOSIS — R0602 Shortness of breath: Secondary | ICD-10-CM | POA: Diagnosis not present

## 2018-06-11 MED ORDER — IOPAMIDOL (ISOVUE-370) INJECTION 76%
INTRAVENOUS | Status: AC
  Administered 2018-06-11: 53 mL
  Filled 2018-06-11: qty 100

## 2018-06-20 ENCOUNTER — Encounter: Payer: Self-pay | Admitting: Internal Medicine

## 2018-06-20 ENCOUNTER — Ambulatory Visit: Payer: Medicare Other | Attending: Internal Medicine | Admitting: Internal Medicine

## 2018-06-20 VITALS — BP 156/98 | HR 79 | Temp 98.4°F | Resp 16 | Wt 212.0 lb

## 2018-06-20 DIAGNOSIS — Z881 Allergy status to other antibiotic agents status: Secondary | ICD-10-CM | POA: Diagnosis not present

## 2018-06-20 DIAGNOSIS — Z9851 Tubal ligation status: Secondary | ICD-10-CM | POA: Insufficient documentation

## 2018-06-20 DIAGNOSIS — Z79899 Other long term (current) drug therapy: Secondary | ICD-10-CM | POA: Insufficient documentation

## 2018-06-20 DIAGNOSIS — R053 Chronic cough: Secondary | ICD-10-CM

## 2018-06-20 DIAGNOSIS — Z9889 Other specified postprocedural states: Secondary | ICD-10-CM | POA: Diagnosis not present

## 2018-06-20 DIAGNOSIS — E669 Obesity, unspecified: Secondary | ICD-10-CM | POA: Insufficient documentation

## 2018-06-20 DIAGNOSIS — Z87891 Personal history of nicotine dependence: Secondary | ICD-10-CM | POA: Insufficient documentation

## 2018-06-20 DIAGNOSIS — Z23 Encounter for immunization: Secondary | ICD-10-CM | POA: Diagnosis not present

## 2018-06-20 DIAGNOSIS — Z880 Allergy status to penicillin: Secondary | ICD-10-CM | POA: Insufficient documentation

## 2018-06-20 DIAGNOSIS — Z77120 Contact with and (suspected) exposure to mold (toxic): Secondary | ICD-10-CM | POA: Diagnosis not present

## 2018-06-20 DIAGNOSIS — R05 Cough: Secondary | ICD-10-CM | POA: Diagnosis not present

## 2018-06-20 DIAGNOSIS — R0982 Postnasal drip: Secondary | ICD-10-CM | POA: Diagnosis not present

## 2018-06-20 DIAGNOSIS — Z6835 Body mass index (BMI) 35.0-35.9, adult: Secondary | ICD-10-CM | POA: Diagnosis not present

## 2018-06-20 DIAGNOSIS — Z888 Allergy status to other drugs, medicaments and biological substances status: Secondary | ICD-10-CM | POA: Insufficient documentation

## 2018-06-20 DIAGNOSIS — I10 Essential (primary) hypertension: Secondary | ICD-10-CM | POA: Diagnosis not present

## 2018-06-20 DIAGNOSIS — Z7951 Long term (current) use of inhaled steroids: Secondary | ICD-10-CM | POA: Diagnosis not present

## 2018-06-20 MED ORDER — AMLODIPINE BESYLATE 10 MG PO TABS: 10.0000 mg | ORAL_TABLET | Freq: Every day | ORAL | 6 refills | Status: AC

## 2018-06-20 MED ORDER — LORATADINE 10 MG PO TABS: 10.0000 mg | ORAL_TABLET | Freq: Every day | ORAL | 11 refills | Status: AC

## 2018-06-20 MED ORDER — FLUTICASONE PROPIONATE 50 MCG/ACT NA SUSP: 2.0000 | Freq: Every day | NASAL | 6 refills | Status: AC

## 2018-06-20 NOTE — Patient Instructions (Addendum)
Increase amlodipine to 10 mg daily.  Continue to limit salt in the foods. Start taking Claritin 10 mg daily.  He has Flonase nasal spray once a day in each nostril. Please try to get the omeprazole and take that daily also.  Influenza Virus Vaccine injection (Fluarix) What is this medicine? INFLUENZA VIRUS VACCINE (in floo EN zuh VAHY ruhs vak SEEN) helps to reduce the risk of getting influenza also known as the flu. This medicine may be used for other purposes; ask your health care provider or pharmacist if you have questions. COMMON BRAND NAME(S): Fluarix, Fluzone What should I tell my health care provider before I take this medicine? They need to know if you have any of these conditions: -bleeding disorder like hemophilia -fever or infection -Guillain-Barre syndrome or other neurological problems -immune system problems -infection with the human immunodeficiency virus (HIV) or AIDS -low blood platelet counts -multiple sclerosis -an unusual or allergic reaction to influenza virus vaccine, eggs, chicken proteins, latex, gentamicin, other medicines, foods, dyes or preservatives -pregnant or trying to get pregnant -breast-feeding How should I use this medicine? This vaccine is for injection into a muscle. It is given by a health care professional. A copy of Vaccine Information Statements will be given before each vaccination. Read this sheet carefully each time. The sheet may change frequently. Talk to your pediatrician regarding the use of this medicine in children. Special care may be needed. Overdosage: If you think you have taken too much of this medicine contact a poison control center or emergency room at once. NOTE: This medicine is only for you. Do not share this medicine with others. What if I miss a dose? This does not apply. What may interact with this medicine? -chemotherapy or radiation therapy -medicines that lower your immune system like etanercept, anakinra, infliximab,  and adalimumab -medicines that treat or prevent blood clots like warfarin -phenytoin -steroid medicines like prednisone or cortisone -theophylline -vaccines This list may not describe all possible interactions. Give your health care provider a list of all the medicines, herbs, non-prescription drugs, or dietary supplements you use. Also tell them if you smoke, drink alcohol, or use illegal drugs. Some items may interact with your medicine. What should I watch for while using this medicine? Report any side effects that do not go away within 3 days to your doctor or health care professional. Call your health care provider if any unusual symptoms occur within 6 weeks of receiving this vaccine. You may still catch the flu, but the illness is not usually as bad. You cannot get the flu from the vaccine. The vaccine will not protect against colds or other illnesses that may cause fever. The vaccine is needed every year. What side effects may I notice from receiving this medicine? Side effects that you should report to your doctor or health care professional as soon as possible: -allergic reactions like skin rash, itching or hives, swelling of the face, lips, or tongue Side effects that usually do not require medical attention (report to your doctor or health care professional if they continue or are bothersome): -fever -headache -muscle aches and pains -pain, tenderness, redness, or swelling at site where injected -weak or tired This list may not describe all possible side effects. Call your doctor for medical advice about side effects. You may report side effects to FDA at 1-800-FDA-1088. Where should I keep my medicine? This vaccine is only given in a clinic, pharmacy, doctor's office, or other health care setting and will not  be stored at home. NOTE: This sheet is a summary. It may not cover all possible information. If you have questions about this medicine, talk to your doctor, pharmacist, or  health care provider.  2018 Elsevier/Gold Standard (2008-03-11 09:30:40)
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
## 2018-06-20 NOTE — Progress Notes (Signed)
Pt states when she coughs she still has the chest pain but nothing bad

## 2018-08-01 ENCOUNTER — Encounter: Payer: Self-pay | Admitting: Internal Medicine

## 2018-08-01 ENCOUNTER — Ambulatory Visit: Payer: Medicare Other | Attending: Internal Medicine | Admitting: Internal Medicine

## 2018-08-01 ENCOUNTER — Telehealth: Payer: Self-pay | Admitting: Internal Medicine

## 2018-08-01 VITALS — BP 130/87 | HR 75 | Temp 98.1°F | Resp 16 | Wt 215.8 lb

## 2018-08-01 DIAGNOSIS — Z5321 Procedure and treatment not carried out due to patient leaving prior to being seen by health care provider: Secondary | ICD-10-CM | POA: Diagnosis not present

## 2018-08-01 DIAGNOSIS — I10 Essential (primary) hypertension: Secondary | ICD-10-CM | POA: Diagnosis not present

## 2018-08-01 NOTE — Telephone Encounter (Signed)
PC placed to pt today.  I left message stating that I saw she was here today and left before being seen.  I am sorry that I missed her and requested that she call if she wishes to reschedule.

## 2018-08-03 NOTE — Progress Notes (Signed)
Pt left before being seen

## 2018-09-04 DIAGNOSIS — H25012 Cortical age-related cataract, left eye: Secondary | ICD-10-CM | POA: Diagnosis not present

## 2018-09-04 DIAGNOSIS — H2511 Age-related nuclear cataract, right eye: Secondary | ICD-10-CM | POA: Diagnosis not present

## 2018-09-04 DIAGNOSIS — H2512 Age-related nuclear cataract, left eye: Secondary | ICD-10-CM | POA: Diagnosis not present

## 2018-09-04 DIAGNOSIS — H25011 Cortical age-related cataract, right eye: Secondary | ICD-10-CM | POA: Diagnosis not present

## 2018-09-25 DIAGNOSIS — H25011 Cortical age-related cataract, right eye: Secondary | ICD-10-CM | POA: Diagnosis not present

## 2018-09-25 DIAGNOSIS — H2511 Age-related nuclear cataract, right eye: Secondary | ICD-10-CM | POA: Diagnosis not present

## 2018-09-26 DIAGNOSIS — H44731 Retained (nonmagnetic) (old) foreign body in lens, right eye: Secondary | ICD-10-CM | POA: Diagnosis not present

## 2018-09-26 DIAGNOSIS — H40051 Ocular hypertension, right eye: Secondary | ICD-10-CM | POA: Diagnosis not present

## 2018-09-26 DIAGNOSIS — Z961 Presence of intraocular lens: Secondary | ICD-10-CM | POA: Diagnosis not present

## 2018-09-26 DIAGNOSIS — Z9841 Cataract extraction status, right eye: Secondary | ICD-10-CM | POA: Diagnosis not present

## 2018-09-26 DIAGNOSIS — Z48810 Encounter for surgical aftercare following surgery on the sense organs: Secondary | ICD-10-CM | POA: Diagnosis not present

## 2018-09-27 DIAGNOSIS — Z9841 Cataract extraction status, right eye: Secondary | ICD-10-CM | POA: Diagnosis not present

## 2018-09-27 DIAGNOSIS — Z961 Presence of intraocular lens: Secondary | ICD-10-CM | POA: Diagnosis not present

## 2018-09-27 DIAGNOSIS — H11431 Conjunctival hyperemia, right eye: Secondary | ICD-10-CM | POA: Diagnosis not present

## 2018-09-27 DIAGNOSIS — H40051 Ocular hypertension, right eye: Secondary | ICD-10-CM | POA: Diagnosis not present

## 2018-09-27 DIAGNOSIS — H44731 Retained (nonmagnetic) (old) foreign body in lens, right eye: Secondary | ICD-10-CM | POA: Diagnosis not present

## 2018-09-27 DIAGNOSIS — Z48810 Encounter for surgical aftercare following surgery on the sense organs: Secondary | ICD-10-CM | POA: Diagnosis not present

## 2018-10-04 DIAGNOSIS — H11431 Conjunctival hyperemia, right eye: Secondary | ICD-10-CM | POA: Diagnosis not present

## 2018-10-04 DIAGNOSIS — Z79899 Other long term (current) drug therapy: Secondary | ICD-10-CM | POA: Diagnosis not present

## 2018-10-04 DIAGNOSIS — Z961 Presence of intraocular lens: Secondary | ICD-10-CM | POA: Diagnosis not present

## 2018-10-04 DIAGNOSIS — H44731 Retained (nonmagnetic) (old) foreign body in lens, right eye: Secondary | ICD-10-CM | POA: Diagnosis not present

## 2018-10-04 DIAGNOSIS — H40051 Ocular hypertension, right eye: Secondary | ICD-10-CM | POA: Diagnosis not present

## 2018-10-18 DIAGNOSIS — H44731 Retained (nonmagnetic) (old) foreign body in lens, right eye: Secondary | ICD-10-CM | POA: Diagnosis not present

## 2018-10-18 DIAGNOSIS — Z79899 Other long term (current) drug therapy: Secondary | ICD-10-CM | POA: Diagnosis not present

## 2018-10-18 DIAGNOSIS — H40051 Ocular hypertension, right eye: Secondary | ICD-10-CM | POA: Diagnosis not present

## 2018-10-18 DIAGNOSIS — Z961 Presence of intraocular lens: Secondary | ICD-10-CM | POA: Diagnosis not present

## 2018-10-18 DIAGNOSIS — Z48810 Encounter for surgical aftercare following surgery on the sense organs: Secondary | ICD-10-CM | POA: Diagnosis not present

## 2018-11-08 ENCOUNTER — Other Ambulatory Visit: Payer: Self-pay | Admitting: Internal Medicine

## 2018-11-08 DIAGNOSIS — Z961 Presence of intraocular lens: Secondary | ICD-10-CM | POA: Diagnosis not present

## 2018-11-08 DIAGNOSIS — H35352 Cystoid macular degeneration, left eye: Secondary | ICD-10-CM | POA: Diagnosis not present

## 2018-11-08 DIAGNOSIS — H40051 Ocular hypertension, right eye: Secondary | ICD-10-CM | POA: Diagnosis not present

## 2018-11-08 DIAGNOSIS — Z48810 Encounter for surgical aftercare following surgery on the sense organs: Secondary | ICD-10-CM | POA: Diagnosis not present

## 2018-11-08 DIAGNOSIS — H44731 Retained (nonmagnetic) (old) foreign body in lens, right eye: Secondary | ICD-10-CM | POA: Diagnosis not present

## 2018-11-12 ENCOUNTER — Telehealth: Payer: Self-pay | Admitting: Internal Medicine

## 2018-11-12 MED ORDER — LOSARTAN POTASSIUM 25 MG PO TABS: 25.0000 mg | ORAL_TABLET | Freq: Every day | ORAL | 1 refills | Status: AC
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
## 2019-01-24 ENCOUNTER — Other Ambulatory Visit: Payer: Self-pay | Admitting: Internal Medicine

## 2019-01-24 DIAGNOSIS — R053 Chronic cough: Secondary | ICD-10-CM

## 2019-01-24 DIAGNOSIS — R05 Cough: Secondary | ICD-10-CM

## 2019-01-30 DIAGNOSIS — L0291 Cutaneous abscess, unspecified: Secondary | ICD-10-CM | POA: Diagnosis not present

## 2019-02-06 DIAGNOSIS — L732 Hidradenitis suppurativa: Secondary | ICD-10-CM | POA: Diagnosis not present

## 2019-05-01 DIAGNOSIS — K5790 Diverticulosis of intestine, part unspecified, without perforation or abscess without bleeding: Secondary | ICD-10-CM | POA: Diagnosis not present

## 2019-05-01 DIAGNOSIS — Z780 Asymptomatic menopausal state: Secondary | ICD-10-CM | POA: Diagnosis not present

## 2019-05-01 DIAGNOSIS — E669 Obesity, unspecified: Secondary | ICD-10-CM | POA: Diagnosis not present

## 2019-05-01 DIAGNOSIS — H409 Unspecified glaucoma: Secondary | ICD-10-CM | POA: Diagnosis not present

## 2019-05-01 DIAGNOSIS — I10 Essential (primary) hypertension: Secondary | ICD-10-CM | POA: Diagnosis not present

## 2019-05-01 DIAGNOSIS — J45909 Unspecified asthma, uncomplicated: Secondary | ICD-10-CM | POA: Diagnosis not present

## 2019-05-01 DIAGNOSIS — K219 Gastro-esophageal reflux disease without esophagitis: Secondary | ICD-10-CM | POA: Diagnosis not present

## 2019-05-01 DIAGNOSIS — R109 Unspecified abdominal pain: Secondary | ICD-10-CM | POA: Diagnosis not present

## 2019-05-08 DIAGNOSIS — R590 Localized enlarged lymph nodes: Secondary | ICD-10-CM | POA: Diagnosis not present

## 2019-05-08 DIAGNOSIS — Z1231 Encounter for screening mammogram for malignant neoplasm of breast: Secondary | ICD-10-CM | POA: Diagnosis not present

## 2019-05-08 DIAGNOSIS — R599 Enlarged lymph nodes, unspecified: Secondary | ICD-10-CM | POA: Diagnosis not present

## 2019-05-08 DIAGNOSIS — I10 Essential (primary) hypertension: Secondary | ICD-10-CM | POA: Diagnosis not present

## 2019-05-28 IMAGING — CT CT ABD-PELV W/ CM
3 of 5 series · 13 of 36 positions shown, 19 images · IV contrast ([ID] [id number])
Comparison: 01/06/2017

CLINICAL DATA: Followup diverticulitis

EXAM:
CT ABDOMEN AND PELVIS WITH CONTRAST
TECHNIQUE: Multidetector CT imaging of the abdomen and pelvis was performed
using the standard protocol following bolus administration of
intravenous contrast.
CONTRAST:  100mL [id number] IOPAMIDOL ([id number]) INJECTION 61%

[Series 3: abd/pelvis with · axial · 0.74mm/px · z∈[-366,-86]mm · 6 of 79 slices shown, 11 images]
[im 12/79  soft-tissue]
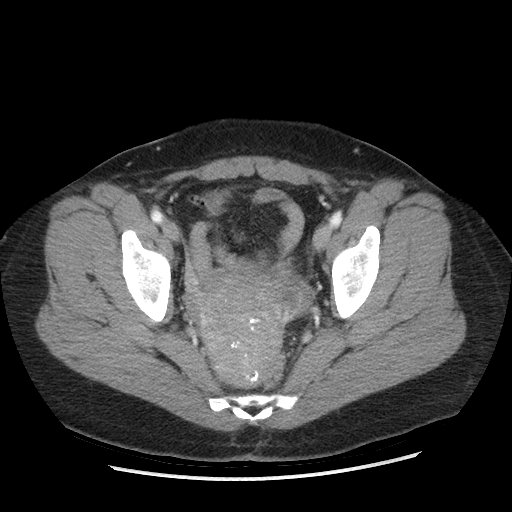
[im 12/79  bone]
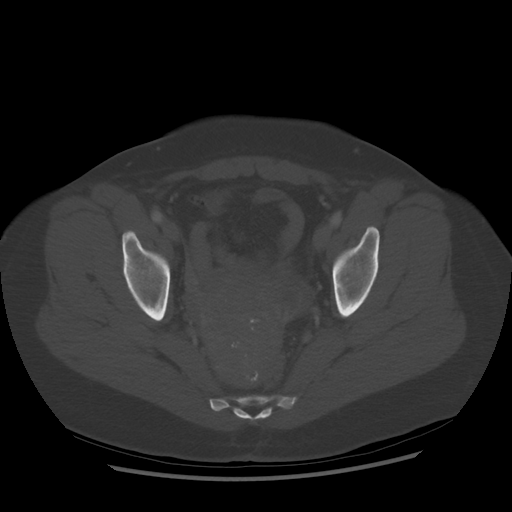
[im 23/79  soft-tissue]
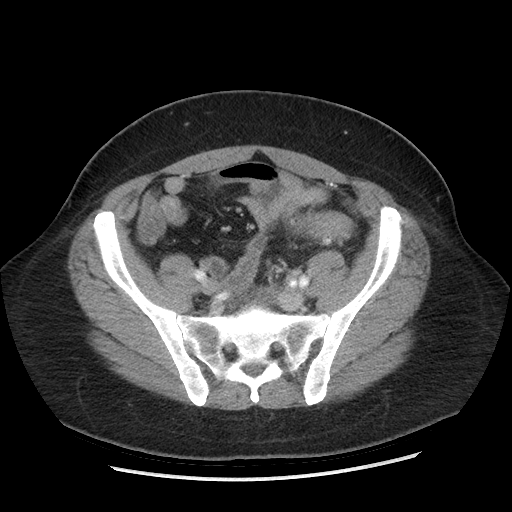
[im 34/79  soft-tissue]
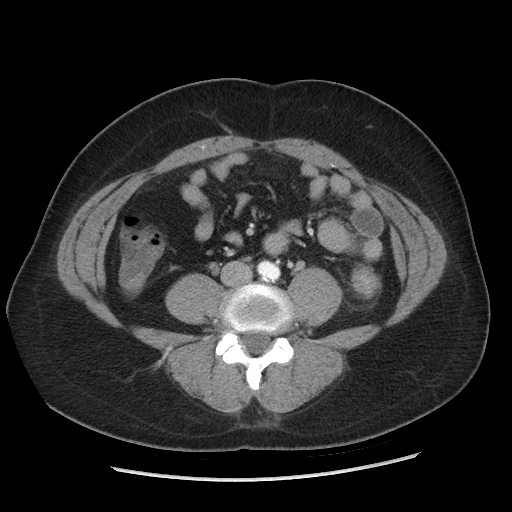
[im 34/79  lung]
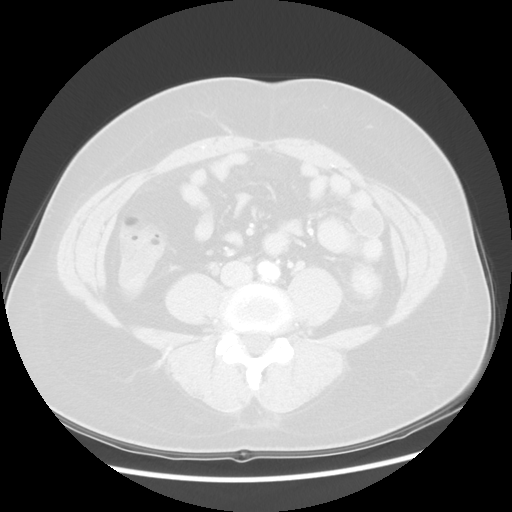
[im 45/79  soft-tissue]
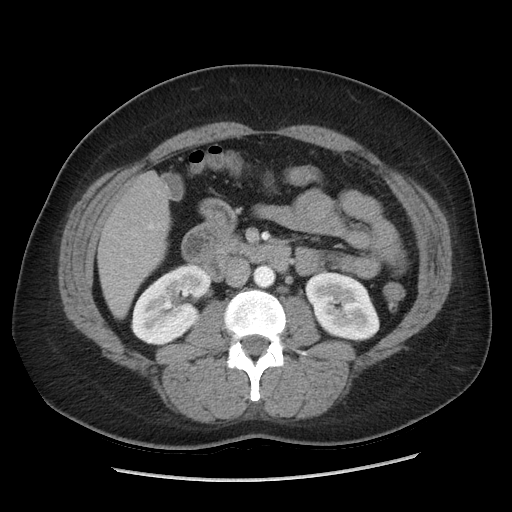
[im 45/79  lung]
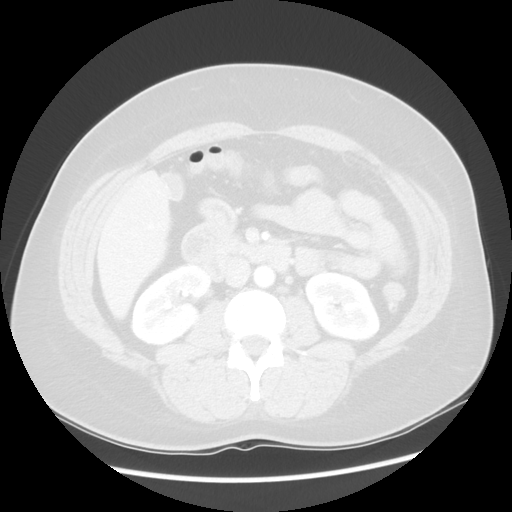
[im 56/79  soft-tissue]
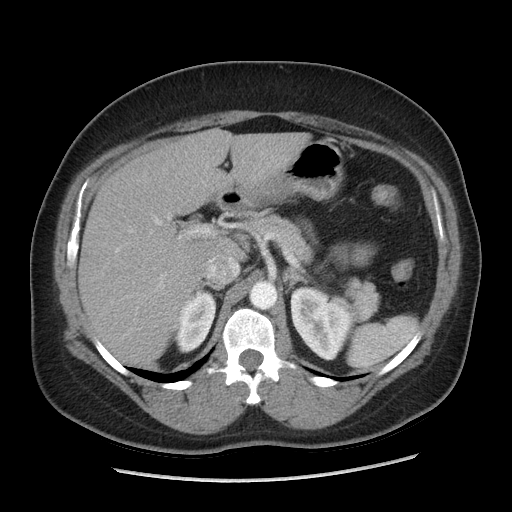
[im 56/79  lung]
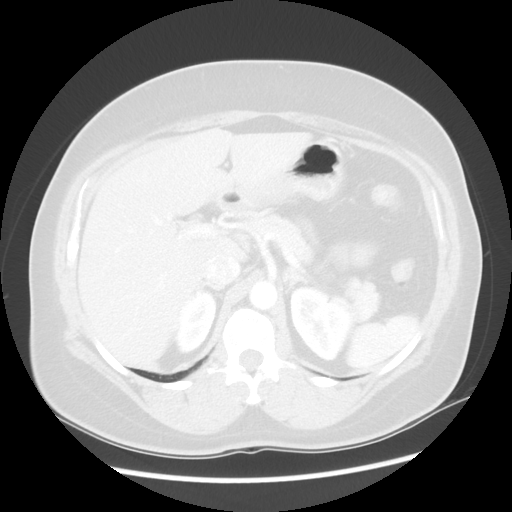
[im 67/79  soft-tissue]
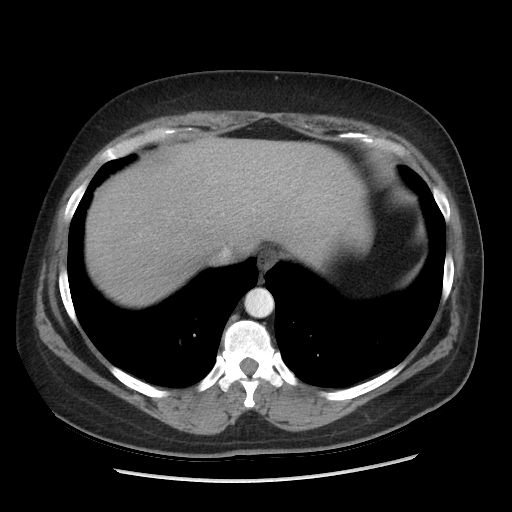
[im 67/79  lung]
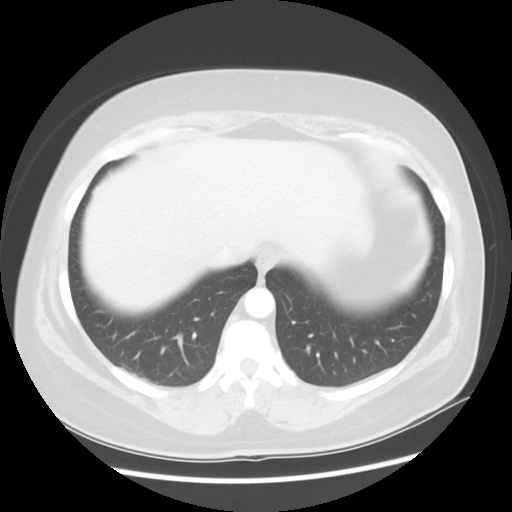

[Series 601: coronal body · coronal · 0.86mm/px · 1 of 123 slices shown, 2 images]
[im 41/123  soft-tissue]
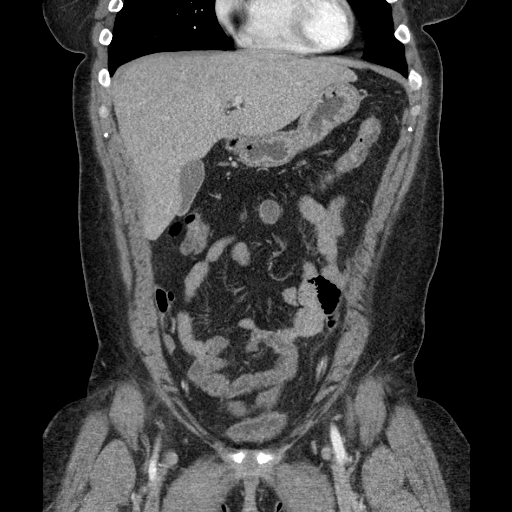
[im 41/123  bone]
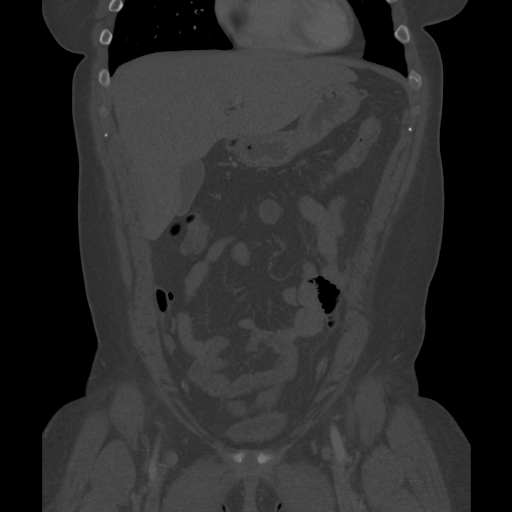

[Series 602: sagittal body · sagittal · 0.86mm/px · 6 of 153 slices shown]
[im 10/153  soft-tissue]
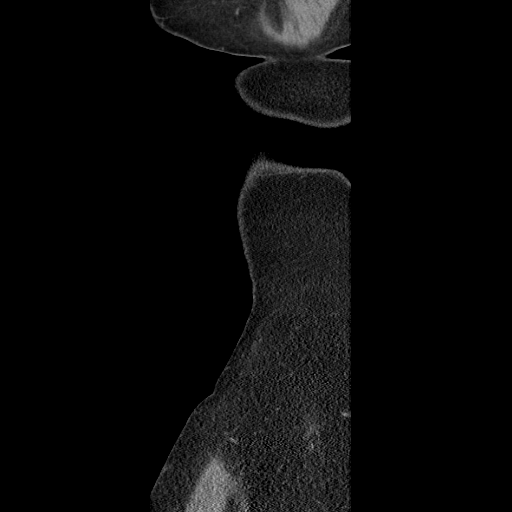
[im 29/153  soft-tissue]
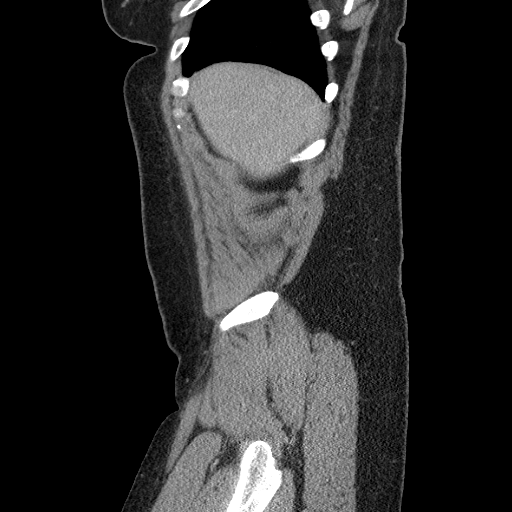
[im 48/153  soft-tissue]
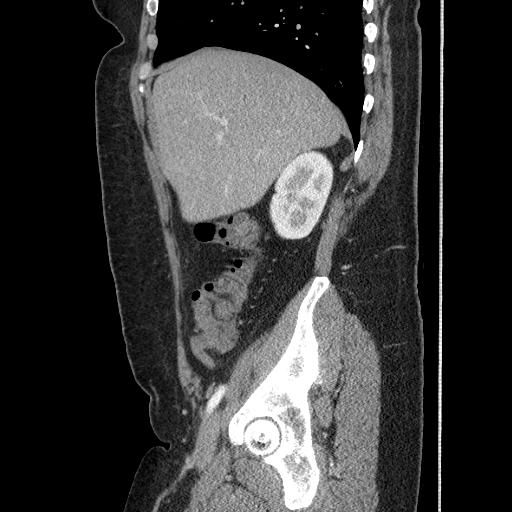
[im 67/153  soft-tissue]
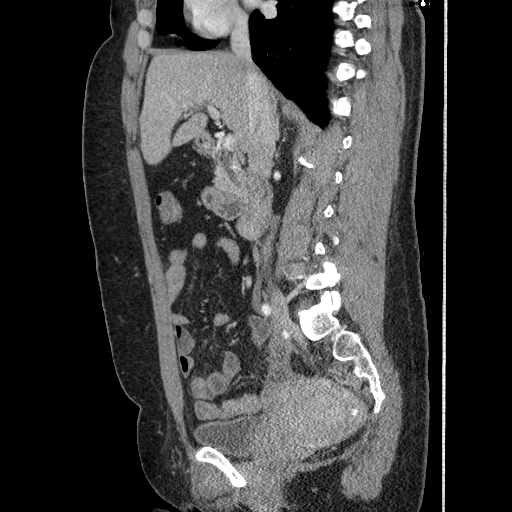
[im 86/153  soft-tissue]
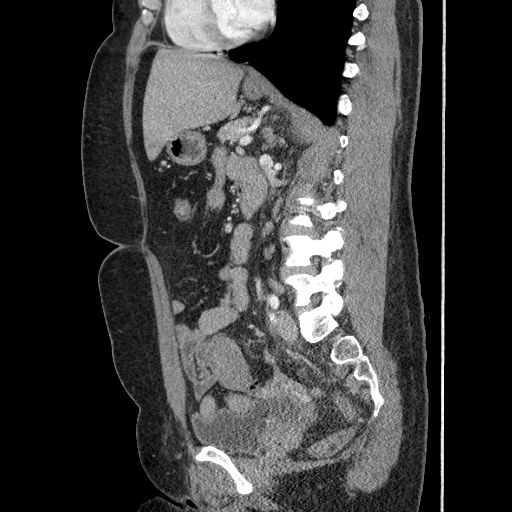
[im 105/153  soft-tissue]
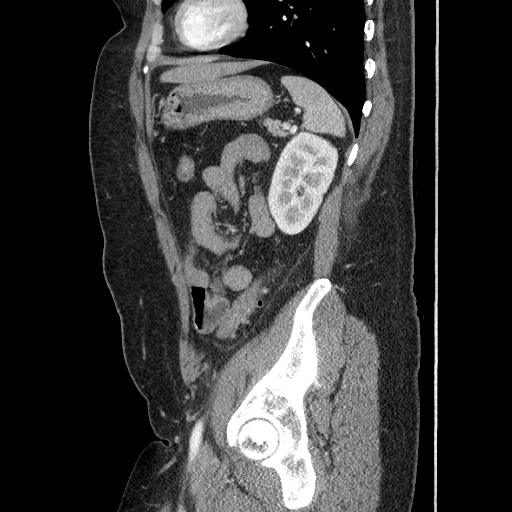

[13 of 36 positions shown; findings below may reference images not displayed]

FINDINGS: Lower chest: No acute abnormality.

Hepatobiliary: No focal liver abnormality is seen. No gallstones,
gallbladder wall thickening, or biliary dilatation.

Pancreas: Unremarkable. No pancreatic ductal dilatation or
surrounding inflammatory changes.

Spleen: Normal in size without focal abnormality.

Adrenals/Urinary Tract: Adrenal glands are unremarkable. Kidneys are
normal, without renal calculi, focal lesion, or hydronephrosis.
Bladder is unremarkable.

Stomach/Bowel: The stomach is normal. The small bowel loops are
unremarkable. The appendix is visualized and appears normal. Wall
thickening and inflammation involving the sigmoid colon is again
noted compatible with acute sigmoid diverticulitis. Wall thickness
of the sigmoid colon measures 1.5 cm, image 66 of series 3.

Vascular/Lymphatic: Aortic atherosclerosis. No enlarged abdominal or
pelvic lymph nodes.

Reproductive: Enlarged fibroid uterus.

Other: No significant free fluid or fluid collections identified.

Musculoskeletal: No acute or significant osseous findings.
IMPRESSION: 1. Persistent wall thickening and inflammation of the sigmoid colon
compatible with diverticulitis. Resolution of previous small fluid
collection. No abscess identified at this time.
2. Recommend correlation with colon cancer screening following
resolution of patient's diverticulitis.
3.  Aortic Atherosclerosis ([id number].F).

## 2019-12-12 DIAGNOSIS — L0291 Cutaneous abscess, unspecified: Secondary | ICD-10-CM | POA: Diagnosis not present

## 2019-12-12 DIAGNOSIS — L732 Hidradenitis suppurativa: Secondary | ICD-10-CM | POA: Diagnosis not present

## 2019-12-12 DIAGNOSIS — Z872 Personal history of diseases of the skin and subcutaneous tissue: Secondary | ICD-10-CM | POA: Diagnosis not present

## 2019-12-26 DIAGNOSIS — Z23 Encounter for immunization: Secondary | ICD-10-CM | POA: Diagnosis not present

## 2020-01-02 DIAGNOSIS — H4051X4 Glaucoma secondary to other eye disorders, right eye, indeterminate stage: Secondary | ICD-10-CM | POA: Diagnosis not present

## 2020-01-02 DIAGNOSIS — H11431 Conjunctival hyperemia, right eye: Secondary | ICD-10-CM | POA: Diagnosis not present

## 2020-01-02 DIAGNOSIS — Z961 Presence of intraocular lens: Secondary | ICD-10-CM | POA: Diagnosis not present

## 2020-01-02 DIAGNOSIS — H44731 Retained (nonmagnetic) (old) foreign body in lens, right eye: Secondary | ICD-10-CM | POA: Diagnosis not present

## 2020-01-13 DIAGNOSIS — L02828 Furuncle of other sites: Secondary | ICD-10-CM | POA: Diagnosis not present

## 2020-01-13 DIAGNOSIS — L0292 Furuncle, unspecified: Secondary | ICD-10-CM | POA: Diagnosis not present

## 2020-02-05 DIAGNOSIS — H409 Unspecified glaucoma: Secondary | ICD-10-CM | POA: Diagnosis not present

## 2020-02-05 DIAGNOSIS — J45909 Unspecified asthma, uncomplicated: Secondary | ICD-10-CM | POA: Diagnosis not present

## 2020-02-05 DIAGNOSIS — L732 Hidradenitis suppurativa: Secondary | ICD-10-CM | POA: Diagnosis not present

## 2020-02-05 DIAGNOSIS — Z79899 Other long term (current) drug therapy: Secondary | ICD-10-CM | POA: Diagnosis not present

## 2020-02-05 DIAGNOSIS — F314 Bipolar disorder, current episode depressed, severe, without psychotic features: Secondary | ICD-10-CM | POA: Diagnosis not present

## 2020-02-05 DIAGNOSIS — Z780 Asymptomatic menopausal state: Secondary | ICD-10-CM | POA: Diagnosis not present

## 2020-02-05 DIAGNOSIS — K219 Gastro-esophageal reflux disease without esophagitis: Secondary | ICD-10-CM | POA: Diagnosis not present

## 2020-02-05 DIAGNOSIS — I10 Essential (primary) hypertension: Secondary | ICD-10-CM | POA: Diagnosis not present

## 2020-02-05 DIAGNOSIS — R7301 Impaired fasting glucose: Secondary | ICD-10-CM | POA: Diagnosis not present

## 2020-02-05 DIAGNOSIS — Z8719 Personal history of other diseases of the digestive system: Secondary | ICD-10-CM | POA: Diagnosis not present

## 2020-02-05 DIAGNOSIS — Z8669 Personal history of other diseases of the nervous system and sense organs: Secondary | ICD-10-CM | POA: Diagnosis not present

## 2020-02-05 DIAGNOSIS — Z9889 Other specified postprocedural states: Secondary | ICD-10-CM | POA: Diagnosis not present

## 2020-04-09 DIAGNOSIS — Z9889 Other specified postprocedural states: Secondary | ICD-10-CM | POA: Diagnosis not present

## 2020-04-09 DIAGNOSIS — M722 Plantar fascial fibromatosis: Secondary | ICD-10-CM | POA: Diagnosis not present

## 2020-04-09 DIAGNOSIS — R2242 Localized swelling, mass and lump, left lower limb: Secondary | ICD-10-CM | POA: Diagnosis not present

## 2020-04-09 DIAGNOSIS — Z7984 Long term (current) use of oral hypoglycemic drugs: Secondary | ICD-10-CM | POA: Diagnosis not present

## 2020-04-09 DIAGNOSIS — E119 Type 2 diabetes mellitus without complications: Secondary | ICD-10-CM | POA: Diagnosis not present

## 2020-04-09 DIAGNOSIS — Z872 Personal history of diseases of the skin and subcutaneous tissue: Secondary | ICD-10-CM | POA: Diagnosis not present

## 2020-04-22 DIAGNOSIS — Z7984 Long term (current) use of oral hypoglycemic drugs: Secondary | ICD-10-CM | POA: Diagnosis not present

## 2020-04-22 DIAGNOSIS — E119 Type 2 diabetes mellitus without complications: Secondary | ICD-10-CM | POA: Diagnosis not present

## 2020-06-17 DIAGNOSIS — H401111 Primary open-angle glaucoma, right eye, mild stage: Secondary | ICD-10-CM | POA: Diagnosis not present

## 2020-06-17 DIAGNOSIS — Z79899 Other long term (current) drug therapy: Secondary | ICD-10-CM | POA: Diagnosis not present

## 2020-06-26 DIAGNOSIS — Z23 Encounter for immunization: Secondary | ICD-10-CM | POA: Diagnosis not present

## 2020-07-30 DIAGNOSIS — Z7984 Long term (current) use of oral hypoglycemic drugs: Secondary | ICD-10-CM | POA: Diagnosis not present

## 2020-07-30 DIAGNOSIS — N393 Stress incontinence (female) (male): Secondary | ICD-10-CM | POA: Diagnosis not present

## 2020-07-30 DIAGNOSIS — E119 Type 2 diabetes mellitus without complications: Secondary | ICD-10-CM | POA: Diagnosis not present

## 2020-07-30 DIAGNOSIS — Z23 Encounter for immunization: Secondary | ICD-10-CM | POA: Diagnosis not present

## 2022-04-24 ENCOUNTER — Ambulatory Visit: Payer: Medicare Other | Admitting: Nurse Practitioner
